# Patient Record
Sex: Female | Born: 1985 | Race: White | Hispanic: No | Marital: Married | Smoking: Never smoker
Health system: Southern US, Community
[De-identification: ages and names within clinical notes are randomized; demographics above are authoritative.]

## PROBLEM LIST (undated history)

## (undated) DIAGNOSIS — B009 Herpesviral infection, unspecified: Secondary | ICD-10-CM

## (undated) DIAGNOSIS — R87612 Low grade squamous intraepithelial lesion on cytologic smear of cervix (LGSIL): Secondary | ICD-10-CM

## (undated) HISTORY — PX: OTHER SURGICAL HISTORY: SHX169

## (undated) HISTORY — DX: Low grade squamous intraepithelial lesion on cytologic smear of cervix (LGSIL): R87.612

## (undated) HISTORY — DX: Herpesviral infection, unspecified: B00.9

---

## 2010-07-17 ENCOUNTER — Ambulatory Visit: Payer: Self-pay

## 2013-07-12 ENCOUNTER — Ambulatory Visit: Payer: Self-pay

## 2013-09-24 ENCOUNTER — Ambulatory Visit: Payer: Self-pay | Admitting: Physician Assistant

## 2013-09-24 LAB — RAPID STREP-A WITH REFLX: Micro Text Report: NEGATIVE

## 2013-09-26 LAB — BETA STREP CULTURE(ARMC)

## 2013-12-18 ENCOUNTER — Ambulatory Visit: Payer: Self-pay | Admitting: Physician Assistant

## 2013-12-18 LAB — URINALYSIS, COMPLETE
BLOOD: NEGATIVE
Bilirubin,UR: NEGATIVE
GLUCOSE, UR: NEGATIVE mg/dL (ref 0–75)
KETONE: NEGATIVE
Leukocyte Esterase: NEGATIVE
NITRITE: NEGATIVE
PH: 6 (ref 4.5–8.0)
PROTEIN: NEGATIVE
Specific Gravity: 1.01 (ref 1.003–1.030)

## 2013-12-18 LAB — PREGNANCY, URINE: Pregnancy Test, Urine: POSITIVE m[IU]/mL

## 2014-01-30 DIAGNOSIS — R87612 Low grade squamous intraepithelial lesion on cytologic smear of cervix (LGSIL): Secondary | ICD-10-CM

## 2014-01-30 HISTORY — DX: Low grade squamous intraepithelial lesion on cytologic smear of cervix (LGSIL): R87.612

## 2017-08-26 ENCOUNTER — Encounter: Payer: Self-pay | Admitting: Certified Nurse Midwife

## 2017-08-26 ENCOUNTER — Ambulatory Visit (INDEPENDENT_AMBULATORY_CARE_PROVIDER_SITE_OTHER): Payer: BLUE CROSS/BLUE SHIELD | Admitting: Certified Nurse Midwife

## 2017-08-26 VITALS — BP 119/88 | HR 67 | Ht 63.0 in | Wt 145.4 lb

## 2017-08-26 DIAGNOSIS — N898 Other specified noninflammatory disorders of vagina: Secondary | ICD-10-CM | POA: Diagnosis not present

## 2017-08-26 MED ORDER — FLUCONAZOLE 150 MG PO TABS: 150.0000 mg | ORAL_TABLET | Freq: Once | ORAL | 0 refills | Status: AC

## 2017-08-26 NOTE — Patient Instructions (Signed)

## 2017-08-26 NOTE — Progress Notes (Signed)
GYN ENCOUNTER NOTE  Subjective:       Theresa Dudley is a 31 y.o. No obstetric history on file. female is here for gynecologic evaluation of the following issues:  1. Vaginal itching for  2 wks. She states that she has used coconut oil, gone with out panties at bed time and stopped shaving which has helped. She does not complain of increased discharge and denies odor. 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 She has not tried [facility]. She is sexually active with one female partner (husband). Denies possibility of STD and declines STD testing.      Gynecologic History Patient's last menstrual period was 08/20/2017 (exact date). Contraception: vasectomy Last Pap:09/14/2014. Results were: normal Last mammogram: N/A.   Obstetric History OB History  Gravida Para Term Preterm AB Living  SAB TAB Ectopic Multiple Live Births          1    # Outcome Date GA Lbr Len/2nd Weight Sex Delivery Anes PTL Lv  1 Term 08/09/14 [redacted]w[redacted]d  7 lb 1.6 oz (3.221 kg) F Vag-Spont   LIV    Obstetric Comments  2015-umbilical cord around baby's neck    Past Medical History:  Diagnosis Date  . HSV infection    history  . 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 none      No current outpatient prescriptions on file prior to visit.   No current facility-administered medications on file prior to visit.     No Known Allergies  Social History   Social History  . Marital status: Single    Spouse name: N/A  . Number of children: N/A  . Years of education: N/A   Occupational History  . Not on file.   Social History Main Topics  . Smoking status: Never Smoker  . Smokeless tobacco: Never Used  . Alcohol use Yes  . Drug use: No  . Sexual activity: Yes    Partners: Male    Birth control/ protection: Other-see comments     Comment: VASECTOMY   Other Topics Concern  . Not on file   Social History Narrative   . No narrative on file    Family History  Problem Relation Age of Onset  . Seizures Mother   . Hypertension Father   . Seizures Sister   . Cancer Maternal Grandmother 1       breast   . Thyroid disease Maternal Grandmother     The following portions of the patient's history were reviewed and updated as appropriate: allergies, current medications, past family history, past medical history, past social history, past surgical history and problem list.  Review of Systems Review of Systems - Negative except as mentioned in HPI Review of Systems - General ROS: negative for - chills, fatigue, fever, hot flashes, malaise or night sweats Hematological and Lymphatic ROS: negative for - bleeding problems or swollen lymph nodes Gastrointestinal ROS: negative for - abdominal pain, blood in stools, change in bowel habits and nausea/vomiting Musculoskeletal ROS: negative for - joint pain, muscle pain or muscular weakness Genito-Urinary ROS: negative for - change in menstrual cycle, dysmenorrhea, dyspareunia, dysuria, genital discharge, genital ulcers, hematuria, incontinence, irregular/heavy menses, nocturia or pelvic pain. Positive: itching  Objective:   BP 119/88   Pulse 67   Ht  (1.6 m)   Wt 145 lb 6.4 oz (  66 kg)   LMP 08/20/2017 (Exact Date) Comment: HUSBSAND HAD VASECTOMY  BMI 25.76 kg/m  CONSTITUTIONAL: Well-developed, well-nourished female in no acute distress.  HENT:  Normocephalic, atraumatic.  NECK: Normal range of motion, supple SKIN: Skin is warm and dry. No rash noted. Not diaphoretic. No erythema. No pallor. NEUROLGIC: Alert and oriented to person, place, and time.  PSYCHIATRIC: Normal mood and affect. Normal behavior. Normal judgment and thought content. CARDIOVASCULAR:Not Examined RESPIRATORY: Not Examined BREASTS: Not Examined ABDOMEN: Soft, non distended; Non tender.  No Organomegaly. PELVIC:  External Genitalia: Normal, no signs of active HSV out break, redness  on labia majora  BUS: Normal  Vagina: Normal, clear discharge  Cervix: Normal MUSCULOSKELETAL: Normal range of motion. No tenderness.  No cyanosis, clubbing, or edema.  Assessment:   Vaginal Itching  Plan:   Sample for Monistat for external itching. Encouraged use of boric acid to prevent vaginal infections. Nuswab today . Diflucan ordered.  Will follow up with results of swab. Encouraged pt to return for physical exam at earliest convince.    Doreene Burke, CNM

## 2017-08-28 ENCOUNTER — Encounter: Payer: Self-pay | Admitting: Certified Nurse Midwife

## 2017-08-28 LAB — NUSWAB BV AND CANDIDA, NAA
Candida albicans, NAA: POSITIVE — AB
Candida glabrata, NAA: NEGATIVE

## 2018-03-31 ENCOUNTER — Ambulatory Visit (INDEPENDENT_AMBULATORY_CARE_PROVIDER_SITE_OTHER): Payer: BLUE CROSS/BLUE SHIELD | Admitting: Certified Nurse Midwife

## 2018-03-31 ENCOUNTER — Encounter: Payer: Self-pay | Admitting: Certified Nurse Midwife

## 2018-03-31 VITALS — BP 123/88 | HR 74 | Ht 63.0 in | Wt 152.1 lb

## 2018-03-31 DIAGNOSIS — Z0000 Encounter for general adult medical examination without abnormal findings: Secondary | ICD-10-CM

## 2018-03-31 DIAGNOSIS — Z124 Encounter for screening for malignant neoplasm of cervix: Secondary | ICD-10-CM

## 2018-03-31 NOTE — Progress Notes (Signed)
GYNECOLOGY ANNUAL PREVENTATIVE CARE ENCOUNTER NOTE  Subjective:   Theresa Dudley is a 32 y.o. G68P1001 female here for a routine annual gynecologic exam.  Current complaints of heavy bleeding and pain at the beginning of her cycle, first 2 days. Pt denies clots, or soaking a pad or more and hour.  Denies  discharge, pelvic pain, problems with intercourse or other gynecologic concerns.  Gynecologic History Patient's last menstrual period was 03/06/2018 (exact date). Contraception: none Last Pap: last year per patient. Results were: Normal, hx of abnormal LGSIL that reverted to 6 week PPV. Last mammogram:n/a. Obstetric History OB History  Gravida Para Term Preterm AB Living  SAB TAB Ectopic Multiple Live Births          1    # Outcome Date GA Lbr Len/2nd Weight Sex Delivery Anes PTL Lv  1 Term 08/09/14 [redacted]w[redacted]d  7 lb 1.6 oz (3.221 kg) F Vag-Spont   LIV    Obstetric Comments  2015-umbilical cord around baby's neck    Past Medical History:  Diagnosis Date  . HSV infection    history  . 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 none      Current Outpatient Medications on File Prior to Visit  Medication Sig Dispense Refill  . clonazePAM (KLONOPIN) 0.5 MG tablet Take by mouth.    . Probiotic Product (PROBIOTIC-10 PO) Take by mouth.    . SF 5000 PLUS 1.1 % CREA dental cream   3  . sodium fluoride (PREVIDENT) 1.1 % GEL dental gel     . valACYclovir (VALTREX) 1000 MG tablet TK 2 TS PO IN THE MORNING AND IN THE EVE  3  . venlafaxine XR (EFFEXOR-XR) 150 MG 24 hr capsule TK ONE C PO D AT 8 AM  1   No current facility-administered medications on file prior to visit.     No Known Allergies  Social History   Socioeconomic History  . Marital status: Single    Spouse name: Not on file  . Number of children: Not on file  . Years of education: Not on file  . Highest education level: Not on file  Occupational History   . Not on file  Social Needs  . Financial resource strain: Not on file  . Food insecurity:    Worry: Not on file    Inability: Not on file  . Transportation needs:    Medical: Not on file    Non-medical: Not on file  Tobacco Use  . Smoking status: Never Smoker  . Smokeless tobacco: Never Used  Substance and Sexual Activity  . Alcohol use: Yes  . Drug use: No  . Sexual activity: Yes    Partners: Male    Birth control/protection: Other-see comments    Comment: VASECTOMY  Lifestyle  . Physical activity:    Days per week: Not on file    Minutes per session: Not on file  . Stress: Not on file  Relationships  . Social connections:    Talks on phone: Not on file    Gets together: Not on file    Attends religious service: Not on file    Active member of club or organization: Not on file    Attends meetings of clubs or organizations: Not on file    Relationship status: Not on file  . Intimate partner violence:    Fear  of current or ex partner: Not on file    Emotionally abused: Not on file    Physically abused: Not on file    Forced sexual activity: Not on file  Other Topics Concern  . Not on file  Social History Narrative  . Not on file    Family History  Problem Relation Age of Onset  . Seizures Mother   . Hypertension Father   . Seizures Sister   . Cancer Maternal Grandmother 75       breast   . Thyroid disease Maternal Grandmother     The following portions of the patient's history were reviewed and updated as appropriate: allergies, current medications, past family history, past medical history, past social history, past surgical history and problem list.  Review of Systems Pertinent items noted in HPI and remainder of comprehensive ROS otherwise negative.   Objective:  BP 123/88   Pulse 74   Ht  (1.6 m)   Wt 152 lb 1 oz (69 kg)   LMP 03/06/2018 (Exact Date)   BMI 26.94 kg/m  CONSTITUTIONAL: Well-developed, well-nourished female in no acute  distress.  HENT:  Normocephalic, atraumatic, External right and left ear normal. Oropharynx is clear and moist EYES: Conjunctivae and EOM are normal. Pupils are equal, round, and reactive to light. No scleral icterus.  NECK: Normal range of motion, supple, no masses.  Normal thyroid.  SKIN: Skin is warm and dry. No rash noted. Not diaphoretic. No erythema. No pallor. NEUROLOGIC: Alert and oriented to person, place, and time. Normal reflexes, muscle tone coordination. No cranial nerve deficit noted. PSYCHIATRIC: Normal mood and affect. Normal behavior. Normal judgment and thought content. CARDIOVASCULAR: Normal heart rate noted, regular rhythm RESPIRATORY: Clear to auscultation bilaterally. Effort and breath sounds normal, no problems with respiration noted. BREASTS: Symmetric in size. No masses, skin changes, nipple drainage, or lymphadenopathy. ABDOMEN: Soft, normal bowel sounds, no distention noted.  No tenderness, rebound or guarding.  PELVIC: Normal appearing external genitalia; normal appearing vaginal mucosa and cervix.  No abnormal discharge noted.  Pap smear obtained.  Normal uterine size, no other palpable masses, no uterine or adnexal tenderness. MUSCULOSKELETAL: Normal range of motion. No tenderness.  No cyanosis, clubbing, or edema.  2+ distal pulses.   Assessment and Plan:  1. Screening for cervical cancer - Pap IG and HPV (high risk) DNA detection Pt decline BC today, would like to try homeopathic methods for heavy bleeding. Offered Lysteda-declined. Encourage use of ibuprofen 2 days prior to onset of period. Lipid Panel today. Will follow up results of pap smear and manage accordingly. Routine preventative health maintenance measures emphasized. Please refer to After Visit Summary for other counseling recommendations.  Follow 1 year annual visit.   Shanika Creacy,CNM/Sharnise Blough,CNM

## 2018-03-31 NOTE — Progress Notes (Signed)
Pt is here for her annual exam

## 2018-03-31 NOTE — Patient Instructions (Signed)
Preventive Care 18-39 Years, Female Preventive care refers to lifestyle choices and visits with your health care provider that can promote health and wellness. What does preventive care include?  A yearly physical exam. This is also called an annual well check.  Dental exams once or twice a year.  Routine eye exams. Ask your health care provider how often you should have your eyes checked.  Personal lifestyle choices, including: ? Daily care of your teeth and gums. ? Regular physical activity. ? Eating a healthy diet. ? Avoiding tobacco and drug use. ? Limiting alcohol use. ? Practicing safe sex. ? Taking vitamin and mineral supplements as recommended by your health care provider. What happens during an annual well check? The services and screenings done by your health care provider during your annual well check will depend on your age, overall health, lifestyle risk factors, and family history of disease. Counseling Your health care provider may ask you questions about your:  Alcohol use.  Tobacco use.  Drug use.  Emotional well-being.  Home and relationship well-being.  Sexual activity.  Eating habits.  Work and work Statistician.  Method of birth control.  Menstrual cycle.  Pregnancy history.  Screening You may have the following tests or measurements:  Height, weight, and BMI.  Diabetes screening. This is done by checking your blood sugar (glucose) after you have not eaten for a while (fasting).  Blood pressure.  Lipid and cholesterol levels. These may be checked every 5 years starting at age 66.  Skin check.  Hepatitis C blood test.  Hepatitis B blood test.  Sexually transmitted disease (STD) testing.  BRCA-related cancer screening. This may be done if you have a family history of breast, ovarian, tubal, or peritoneal cancers.  Pelvic exam and Pap test. This may be done every 3 years starting at age 40. Starting at age 59, this may be done every 5  years if you have a Pap test in combination with an HPV test.  Discuss your test results, treatment options, and if necessary, the need for more tests with your health care provider. Vaccines Your health care provider may recommend certain vaccines, such as:  Influenza vaccine. This is recommended every year.  Tetanus, diphtheria, and acellular pertussis (Tdap, Td) vaccine. You may need a Td booster every 10 years.  Varicella vaccine. You may need this if you have not been vaccinated.  HPV vaccine. If you are 69 or younger, you may need three doses over 6 months.  Measles, mumps, and rubella (MMR) vaccine. You may need at least one dose of MMR. You may also need a second dose.  Pneumococcal 13-valent conjugate (PCV13) vaccine. You may need this if you have certain conditions and were not previously vaccinated.  Pneumococcal polysaccharide (PPSV23) vaccine. You may need one or two doses if you smoke cigarettes or if you have certain conditions.  Meningococcal vaccine. One dose is recommended if you are age 27-21 years and a first-year college student living in a residence hall, or if you have one of several medical conditions. You may also need additional booster doses.  Hepatitis A vaccine. You may need this if you have certain conditions or if you travel or work in places where you may be exposed to hepatitis A.  Hepatitis B vaccine. You may need this if you have certain conditions or if you travel or work in places where you may be exposed to hepatitis B.  Haemophilus influenzae type b (Hib) vaccine. You may need this if  you have certain risk factors.  Talk to your health care provider about which screenings and vaccines you need and how often you need them. This information is not intended to replace advice given to you by your health care provider. Make sure you discuss any questions you have with your health care provider. Document Released: 01/06/2002 Document Revised: 07/30/2016  Document Reviewed: 09/11/2015 Elsevier Interactive Patient Education  [employer].

## 2018-04-01 ENCOUNTER — Encounter (INDEPENDENT_AMBULATORY_CARE_PROVIDER_SITE_OTHER): Payer: Self-pay

## 2018-04-01 LAB — LIPID PANEL
CHOL/HDL RATIO: 2.1 ratio (ref 0.0–4.4)
CHOLESTEROL TOTAL: 122 mg/dL (ref 100–199)
HDL: 59 mg/dL (ref >39)
LDL CALC: 52 mg/dL (ref 0–99)
TRIGLYCERIDES: 53 mg/dL (ref 0–149)
VLDL Cholesterol Cal: 11 mg/dL (ref 5–40)

## 2018-04-12 ENCOUNTER — Encounter (INDEPENDENT_AMBULATORY_CARE_PROVIDER_SITE_OTHER): Payer: Self-pay

## 2018-04-12 ENCOUNTER — Other Ambulatory Visit: Payer: Self-pay | Admitting: Certified Nurse Midwife

## 2018-04-12 LAB — PAP IG AND HPV HIGH-RISK
HPV, HIGH-RISK: NEGATIVE
PAP Smear Comment: 0

## 2018-04-12 MED ORDER — FLUCONAZOLE 150 MG PO TABS: 150.0000 mg | ORAL_TABLET | Freq: Once | ORAL | 0 refills | Status: AC

## 2018-04-12 NOTE — Progress Notes (Signed)
Pap smear shows yeast . Order placed. pT notified via my chart.   Doreene Burke, CNM

## 2018-04-27 ENCOUNTER — Encounter: Payer: Self-pay | Admitting: Certified Nurse Midwife

## 2018-10-12 ENCOUNTER — Other Ambulatory Visit: Payer: Self-pay

## 2018-10-12 MED ORDER — FLUCONAZOLE 150 MG PO TABS: 150.0000 mg | ORAL_TABLET | Freq: Once | ORAL | 1 refills | Status: AC

## 2018-10-26 ENCOUNTER — Ambulatory Visit: Payer: BLUE CROSS/BLUE SHIELD | Admitting: Certified Nurse Midwife

## 2018-10-26 ENCOUNTER — Encounter: Payer: Self-pay | Admitting: Certified Nurse Midwife

## 2018-10-26 ENCOUNTER — Encounter: Payer: BLUE CROSS/BLUE SHIELD | Admitting: Certified Nurse Midwife

## 2018-10-26 VITALS — BP 129/88 | HR 90 | Ht 63.0 in | Wt 165.4 lb

## 2018-10-26 DIAGNOSIS — R102 Pelvic and perineal pain: Secondary | ICD-10-CM | POA: Diagnosis not present

## 2018-10-26 DIAGNOSIS — N939 Abnormal uterine and vaginal bleeding, unspecified: Secondary | ICD-10-CM

## 2018-10-26 DIAGNOSIS — R350 Frequency of micturition: Secondary | ICD-10-CM

## 2018-10-26 DIAGNOSIS — R635 Abnormal weight gain: Secondary | ICD-10-CM | POA: Diagnosis not present

## 2018-10-26 DIAGNOSIS — M545 Low back pain, unspecified: Secondary | ICD-10-CM

## 2018-10-26 NOTE — Patient Instructions (Signed)
Pelvic Pain, Female °Pelvic pain is pain in your lower belly (abdomen), below your belly button and between your hips. The pain may start suddenly (acute), keep coming back (recurring), or last a long time (chronic). Pelvic pain that lasts longer than six months is considered chronic. There are many causes of pelvic pain. Sometimes the cause of your pelvic pain is not known. °Follow these instructions at home: °· Take over-the-counter and prescription medicines only as told by your doctor. °· Rest as told by your doctor. °· Do not have sex it if hurts. °· Keep a journal of your pelvic pain. Write down: °? When the pain started. °? Where the pain is located. °? What seems to make the pain better or worse, such as food or your menstrual cycle. °? Any symptoms you have along with the pain. °· Keep all follow-up visits as told by your doctor. This is important. °Contact a doctor if: °· Medicine does not help your pain. °· Your pain comes back. °· You have new symptoms. °· You have unusual vaginal discharge or bleeding. °· You have a fever or chills. °· You are having a hard time pooping (constipation). °· You have blood in your pee (urine) or poop (stool). °· Your pee smells bad. °· You feel weak or lightheaded. °Get help right away if: °· You have sudden pain that is very bad. °· Your pain continues to get worse. °· You have very bad pain and also have any of the following symptoms: °? A fever. °? Feeling stick to your stomach (nausea). °? Throwing up (vomiting). °? Being very sweaty. °· You pass out (lose consciousness). °This information is not intended to replace advice given to you by your health care provider. Make sure you discuss any questions you have with your health care provider. °Document Released: 04/28/2008 Document Revised: 12/05/2015 Document Reviewed: 08/31/2015 °Elsevier Interactive Patient Education © 2018 Elsevier Inc. ° °

## 2018-10-26 NOTE — Progress Notes (Signed)
GYN ENCOUNTER NOTE  Subjective:       Theresa Dudley is a 32 y.o. G21P1001 female is here for gynecologic evaluation of the following issues:  1. Pelvic pain that is mostly on her right side. It radiates around to her back. She has also experienced weight gain, bloating. She state the pain is 6-7 constant pain that has responded with motrin. She went to the ED and they gave her flexeril that has also helped with the back pain.   She is having frequency in urination but other wise her urine and bowel movements are normal. She is having monthly periods.    Gynecologic History Patient's last menstrual period was 10/14/2018 (exact date). Contraception: vasectomy Last Pap: 03/31/2018. Results were: ASCUS, HPV negative Last mammogram: N/A.   Obstetric History OB History  Gravida Para Term Preterm AB Living  1 1 1     1   SAB TAB Ectopic Multiple Live Births          1    # Outcome Date GA Lbr Len/2nd Weight Sex Delivery Anes PTL Lv  1 Term 08/09/14 [redacted]w[redacted]d  7 lb 1.6 oz (3.221 kg) F Vag-Spont   LIV    Obstetric Comments  2015-umbilical cord around baby's neck    Past Medical History:  Diagnosis Date  . HSV infection    history  . 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 none      Current Outpatient Medications on File Prior to Visit  Medication Sig Dispense Refill  . clonazePAM (KLONOPIN) 0.5 MG tablet Take by mouth.    . cyclobenzaprine (FLEXERIL) 5 MG tablet   0  . DULoxetine (CYMBALTA) 20 MG capsule Take by mouth.    . Probiotic Product (PROBIOTIC-10 PO) Take by mouth.    . propranolol (INDERAL) 40 MG tablet   0  . sodium fluoride (PREVIDENT) 1.1 % GEL dental gel     . traZODone (DESYREL) 50 MG tablet TK 1 TO 2 TS PO Q NIGHT PRF SLP  1  . valACYclovir (VALTREX) 1000 MG tablet TK 2 TS PO IN THE MORNING AND IN THE EVE  3   No current facility-administered medications on file prior to visit.     No Known Allergies  Social  History   Socioeconomic History  . Marital status: Single    Spouse name: Not on file  . Number of children: Not on file  . Years of education: Not on file  . Highest education level: Not on file  Occupational History  . Not on file  Social Needs  . Financial resource strain: Not on file  . Food insecurity:    Worry: Not on file    Inability: Not on file  . Transportation needs:    Medical: Not on file    Non-medical: Not on file  Tobacco Use  . Smoking status: Never Smoker  . Smokeless tobacco: Never Used  Substance and Sexual Activity  . Alcohol use: Yes  . Drug use: No  . Sexual activity: Yes    Partners: Male    Birth control/protection: Other-see comments    Comment: VASECTOMY  Lifestyle  . Physical activity:    Days per week: Not on file    Minutes per session: Not on file  . Stress: Not on file  Relationships  . Social connections:    Talks on phone: Not on file    Gets together: Not  on file    Attends religious service: Not on file    Active member of club or organization: Not on file    Attends meetings of clubs or organizations: Not on file    Relationship status: Not on file  . Intimate partner violence:    Fear of current or ex partner: Not on file    Emotionally abused: Not on file    Physically abused: Not on file    Forced sexual activity: Not on file  Other Topics Concern  . Not on file  Social History Narrative  . Not on file    Family History  Problem Relation Age of Onset  . Seizures Mother   . Hypertension Father   . Seizures Sister   . Cancer Maternal Grandmother 363       breast   . Thyroid disease Maternal Grandmother     The following portions of the patient's history were reviewed and updated as appropriate: allergies, current medications, past family history, past medical history, past social history, past surgical history and problem list.  Review of Systems Review of Systems - Negative except as mentioned in hpi Review of  Systems - General ROS: negative for - chills, fatigue, fever, hot flashes, malaise or night sweats Hematological and Lymphatic ROS: negative for - bleeding problems or swollen lymph nodes Gastrointestinal ROS: negative for - abdominal pain, blood in stools, change in bowel habits and nausea/vomiting. Positive for bloating  Musculoskeletal ROS: negative for - joint pain, muscle pain or muscular weakness Genito-Urinary ROS: negative for - change in menstrual cycle, dysmenorrhea, dyspareunia, dysuria, genital discharge, genital ulcers, hematuria, incontinence, irregular/heavy menses, nocturia. Positive for pelvic pain  Objective:   BP 129/88   Pulse 90   Ht 5\' 3"  (1.6 m)   Wt 165 lb 7 oz (75 kg)   LMP 10/14/2018 (Exact Date)   BMI 29.31 kg/m  CONSTITUTIONAL: Well-developed, well-nourished female in no acute distress.  HENT:  Normocephalic, atraumatic.  NECK: Normal range of motion, supple, no masses.  Normal thyroid.  SKIN: Skin is warm and dry. No rash noted. Not diaphoretic. No erythema. No pallor. NEUROLGIC: Alert and oriented to person, place, and time. PSYCHIATRIC: Normal mood and affect. Normal behavior. Normal judgment and thought content. CARDIOVASCULAR:Not Examined RESPIRATORY: Not Examined BREASTS: Not Examined ABDOMEN: Soft, non distended; tenderness left lower quadrant with palpation.  No Organomegaly. PELVIC:not indicated  MUSCULOSKELETAL: Normal range of motion. No tenderness.  No cyanosis, clubbing, or edema.  Assessment:   1. Weight gain TSH  2. Abnormal uterine bleeding - CBC  3. Pelvic pain - US PELVIS (TRANSABDOMINAL ONLY); Future     Plan:  Discussed possible causes of pain. PT admits to having a history of ovarian cyst but states it does not feel like that. Urine culture , TSH, CBC, u/s ordered. Referral to chiropractor for back pain. Discussed referral to gastro. If all test negative. She verbalizes and agrees to plan of care.   I attest more than 50% of  visit spent reviewing history , discussing possible causes including ovarian cyst, congestive pelvic syndrome, and UTI. Plan of care reviewed. Face to face time 15 min.   Doreene BurkeAnnie Kiran Carline, CNM

## 2018-10-27 ENCOUNTER — Ambulatory Visit (INDEPENDENT_AMBULATORY_CARE_PROVIDER_SITE_OTHER): Payer: BLUE CROSS/BLUE SHIELD

## 2018-10-27 DIAGNOSIS — R102 Pelvic and perineal pain: Secondary | ICD-10-CM

## 2018-10-27 LAB — CBC
Hematocrit: 36.1 % (ref 34.0–46.6)
Hemoglobin: 12.4 g/dL (ref 11.1–15.9)
MCH: 29.3 pg (ref 26.6–33.0)
MCHC: 34.3 g/dL (ref 31.5–35.7)
MCV: 85 fL (ref 79–97)
Platelets: 309 10*3/uL (ref 150–450)
RBC: 4.23 x10E6/uL (ref 3.77–5.28)
RDW: 12.9 % (ref 12.3–15.4)
WBC: 8.6 10*3/uL (ref 3.4–10.8)

## 2018-10-27 LAB — TSH: TSH: 1.87 u[IU]/mL (ref 0.450–4.500)

## 2018-10-28 ENCOUNTER — Other Ambulatory Visit: Payer: Self-pay | Admitting: Family Medicine

## 2018-10-28 DIAGNOSIS — R1084 Generalized abdominal pain: Secondary | ICD-10-CM

## 2018-10-28 LAB — URINE CULTURE: Organism ID, Bacteria: NO GROWTH

## 2018-11-01 ENCOUNTER — Ambulatory Visit
Admission: RE | Admit: 2018-11-01 | Discharge: 2018-11-01 | Disposition: A | Discharge status: Home / Self Care | Payer: BLUE CROSS/BLUE SHIELD | Source: Ambulatory Visit | Attending: Family Medicine | Admitting: Family Medicine

## 2018-11-01 DIAGNOSIS — R1084 Generalized abdominal pain: Secondary | ICD-10-CM | POA: Insufficient documentation

## 2018-11-02 ENCOUNTER — Ambulatory Visit
Admission: RE | Admit: 2018-11-02 | Discharge: 2018-11-02 | Disposition: A | Discharge status: Home / Self Care | Payer: BLUE CROSS/BLUE SHIELD | Source: Ambulatory Visit | Attending: Family Medicine | Admitting: Family Medicine

## 2018-11-02 DIAGNOSIS — R1084 Generalized abdominal pain: Secondary | ICD-10-CM | POA: Insufficient documentation

## 2018-11-02 DIAGNOSIS — K824 Cholesterolosis of gallbladder: Secondary | ICD-10-CM | POA: Diagnosis not present

## 2018-11-02 DIAGNOSIS — R635 Abnormal weight gain: Secondary | ICD-10-CM | POA: Insufficient documentation

## 2018-11-09 ENCOUNTER — Other Ambulatory Visit: Payer: Self-pay | Admitting: Certified Nurse Midwife

## 2018-11-09 DIAGNOSIS — R140 Abdominal distension (gaseous): Secondary | ICD-10-CM

## 2018-11-09 NOTE — Progress Notes (Signed)
Abdominal bloating, order placed to gastro.   Doreene BurkeAnnie Saryna Kneeland, CNM

## 2018-12-08 ENCOUNTER — Ambulatory Visit (INDEPENDENT_AMBULATORY_CARE_PROVIDER_SITE_OTHER): Payer: BLUE CROSS/BLUE SHIELD | Admitting: Obstetrics and Gynecology

## 2018-12-08 ENCOUNTER — Encounter: Payer: Self-pay | Admitting: Obstetrics and Gynecology

## 2018-12-08 VITALS — BP 125/83 | HR 85 | Ht 63.0 in | Wt 164.9 lb

## 2018-12-08 DIAGNOSIS — Z30011 Encounter for initial prescription of contraceptive pills: Secondary | ICD-10-CM | POA: Diagnosis not present

## 2018-12-08 DIAGNOSIS — N920 Excessive and frequent menstruation with regular cycle: Secondary | ICD-10-CM

## 2018-12-08 MED ORDER — DESOGESTREL-ETHINYL ESTRADIOL 0.15-0.02/0.01 MG (21/5) PO TABS: 1.0000 | ORAL_TABLET | Freq: Every day | ORAL | 1 refills | Status: DC

## 2018-12-08 NOTE — Progress Notes (Signed)
HPI:      [name] is a 33 y.o. G1P1001 who LMP was Patient's last menstrual period was 11/11/2018.  Subjective:   She presents today as referral from Doreene Burke.  Since the birth of her daughter she has had very heavy menstrual bleeding lasting 7 days.  She does not like the heavy bleeding and cramping and would like a solution.  She has taken OCPs in the past and they have made her cycles lighter but she is not sure if she wants something containing "hormones."  She is also specifically inquiring about endometrial ablation. Of significant note the patient's husband has a vasectomy-so birth control is not an issue. Patient also has an issue with recurrent ovarian cysts, breast cysts which appear cyclically and some acne.     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 She does not have a problem list on file. She  has a past surgical history that includes none. Her family history includes Cancer (age of onset: 76) in her maternal grandmother; Hypertension in her father; Seizures in her mother and sister; Thyroid disease in her maternal grandmother. She  reports that she has never smoked. She has never used smokeless tobacco. She reports current alcohol use. She reports that she does not use drugs. She has a current medication list which includes the following prescription(s): clonazepam, duloxetine, probiotic product, sodium fluoride, trazodone, valacyclovir, and desogestrel-ethinyl estradiol. She has No Known Allergies.       Review of Systems:  Review of Systems  Constitutional: Denied constitutional symptoms, night sweats, recent illness, fatigue, fever, insomnia and weight loss.  Eyes: Denied eye symptoms, eye pain, photophobia, vision change and visual disturbance.  Ears/Nose/Throat/Neck: Denied ear, nose, throat or  neck symptoms, hearing loss, nasal discharge, sinus congestion and sore throat.  Cardiovascular: Denied cardiovascular symptoms, arrhythmia, chest pain/pressure, edema, exercise intolerance, orthopnea and palpitations.  Respiratory: Denied pulmonary symptoms, asthma, pleuritic pain, productive sputum, cough, dyspnea and wheezing.  Gastrointestinal: Denied, gastro-esophageal reflux, melena, nausea and vomiting.  Genitourinary: See HPI for additional information.  Musculoskeletal: Denied musculoskeletal symptoms, stiffness, swelling, muscle weakness and myalgia.  Dermatologic: Denied dermatology symptoms, rash and scar.  Neurologic: Denied neurology symptoms, dizziness, headache, neck pain and syncope.  Psychiatric: Denied psychiatric symptoms, anxiety and depression.  Endocrine: Denied endocrine symptoms including hot flashes and night sweats.   Meds:   Current Outpatient Medications on File Prior to Visit  Medication Sig Dispense Refill  . clonazePAM (KLONOPIN) 0.5 MG tablet Take by mouth.    . DULoxetine (CYMBALTA) 20 MG capsule Take by mouth.    . Probiotic Product (PROBIOTIC-10 PO) Take by mouth.    . sodium fluoride (PREVIDENT) 1.1 % GEL dental gel     . traZODone (DESYREL) 50 MG tablet TK 1 TO 2 TS PO Q NIGHT PRF SLP  1  . valACYclovir (VALTREX) 1000 MG tablet TK 2 TS PO IN THE MORNING AND IN THE EVE  3   No current facility-administered medications on file prior to visit.     Objective:     Vitals:   12/08/18 0855  BP: 125/83  Pulse: 85                Assessment:    G1P1001 There are no active problems to  display for this patient.    1. Menorrhagia with regular cycle   2. Initiation of OCP (BCP)     Patient has menorrhagia with regular cycle.  Adenomyosis is a possibility.   Plan:            1.  We have discussed endometrial ablation, OCPs, and IUD for control of her menorrhagia.  The risks and benefits of each were systematically reviewed in detail.  Both  hormonal and nonhormonal solutions and effects were discussed.  After a very long discussion she has chosen OCPs as her best option because it will give her cycle control as it has in the past, help with her breast cysts and ovarian cysts as well as her acne. The option of a monthly cycle control pill versus a 21-month cycle pill was discussed with her and she has chosen a monthly cyclic pill. Of course should she fail OCPs she will again consider the option of IUD versus endometrial ablation. 2.OCPs The risks /benefits of OCPs have been explained to the patient in detail.  Product literature has been given to her.  I have instructed her in the use of OCPs and have given her literature reinforcing this information.  I have explained to the patient that OCPs are not as effective for birth control during the first month of use, and that another form of contraception should be used during this time.  Both first-day start and Sunday start have been explained.  The risks and benefits of each was discussed.  She has been made aware of  the fact that other medications may affect the efficacy of OCPs.  I have answered all of her questions, and I believe that she has an understanding of the effectiveness and use of OCPs.  Orders No orders of the defined types were placed in this encounter.    Meds ordered this encounter  Medications  . desogestrel-ethinyl estradiol (MIRCETTE) 0.15-0.02/0.01 MG (21/5) tablet    Sig: Take 1 tablet by mouth at bedtime.    Dispense:  3 Package    Refill:  1      F/U  Return in about 4 months (around 04/08/2019). I spent 27 minutes involved in the care of this patient of which greater than 50% was spent discussing menorrhagia and its causes, risks and benefits of endometrial ablation OCPs and IUD.  Please see above.  All questions answered.  Elonda Husky, M.D. 12/08/2018 10:05 AM

## 2018-12-08 NOTE — Progress Notes (Signed)
Patient comes in today to discuss having an ablation. She is an Programmer, multimedia patient and has discussed with Pattricia Boss. She is having heavy bleeding, cramping, and bloating during her menstrual cycle. Her husband has had a vasectomy.

## 2018-12-27 ENCOUNTER — Other Ambulatory Visit: Payer: Self-pay

## 2018-12-27 ENCOUNTER — Encounter: Payer: Self-pay | Admitting: Emergency Medicine

## 2018-12-27 ENCOUNTER — Ambulatory Visit
Admission: EM | Admit: 2018-12-27 | Discharge: 2018-12-27 | Disposition: A | Discharge status: Home / Self Care | Payer: BLUE CROSS/BLUE SHIELD | Attending: Family Medicine | Admitting: Family Medicine

## 2018-12-27 DIAGNOSIS — J111 Influenza due to unidentified influenza virus with other respiratory manifestations: Secondary | ICD-10-CM

## 2018-12-27 DIAGNOSIS — R0981 Nasal congestion: Secondary | ICD-10-CM

## 2018-12-27 DIAGNOSIS — J029 Acute pharyngitis, unspecified: Secondary | ICD-10-CM

## 2018-12-27 DIAGNOSIS — R69 Illness, unspecified: Principal | ICD-10-CM

## 2018-12-27 LAB — RAPID STREP SCREEN (MED CTR MEBANE ONLY): Streptococcus, Group A Screen (Direct): NEGATIVE

## 2018-12-27 MED ORDER — BALOXAVIR MARBOXIL(40 MG DOSE) 2 X 20 MG PO TBPK: 40.0000 mg | ORAL_TABLET | Freq: Once | ORAL | 0 refills | Status: AC

## 2018-12-27 NOTE — Discharge Instructions (Signed)
Take medication as prescribed. Rest. Drink plenty of fluids.  ° °Follow up with your primary care physician this week as needed. Return to Urgent care for new or worsening concerns.  ° °

## 2018-12-27 NOTE — ED Provider Notes (Signed)
[facility] ____________________________________________  Time seen: Approximately 8:01 PM  I have reviewed the triage vital signs and the nursing notes.   HISTORY  Chief Complaint Sore Throat and Nasal Congestion   HPI Theresa Dudley is a 33 y.o. female presenting for evaluation of nasal congestion, sore throat and body aches progressing today.  States minimal congestion earlier today, but reports congestion has been progressing.  States sore throat son this afternoon, stating it is mild.  States is even started with diffuse head to toe body aches.  Denies known fevers.  States stepdaughters with similar complaints starting today as well.  Has continued to eat and drink well.  Denies chest pain, shortness of breath, abdominal pain or rash.  Reports otherwise doing well denies other complaints.  No medications taken over-the-counter prior to arrival tonight.  Mariel Kansky, MD: PCP Patient's last menstrual period was 12/24/2018.denies pregnancy.    Past Medical History:  Diagnosis Date  . HSV infection    history  . LGSIL on Pap smear of cervix 01/30/2014    There are no active problems to display for this patient.   Past Surgical History:  Procedure Laterality Date  . none       No current facility-administered medications for this encounter.   Current Outpatient Medications:  .  clonazePAM (KLONOPIN) 0.5 MG tablet, Take by mouth., Disp: , Rfl:  .  desogestrel-ethinyl estradiol (MIRCETTE) 0.15-0.02/0.01 MG (21/5) tablet, Take 1 tablet by mouth at bedtime., Disp: 3 Package, Rfl: 1 .  DULoxetine (CYMBALTA) 20 MG capsule, Take by mouth., Disp: , Rfl:  .  Probiotic Product (PROBIOTIC-10 PO), Take by mouth., Disp: , Rfl:  .  sodium fluoride (PREVIDENT) 1.1 % GEL dental gel, , Disp: , Rfl:  .  traZODone (DESYREL) 50 MG tablet, TK 1 TO 2 TS PO Q NIGHT PRF SLP, Disp: , Rfl: 1 .  valACYclovir (VALTREX) 1000 MG tablet, TK 2 TS PO IN THE MORNING AND IN THE EVE, Disp:  , Rfl: 3 .  Baloxavir Marboxil,40 MG Dose, (XOFLUZA) 2 x 20 MG TBPK, Take 40 mg by mouth once for 1 dose., Disp: 2 each, Rfl: 0  Allergies Patient has no known allergies.  Family History  Problem Relation Age of Onset  . Seizures Mother   . Hypertension Father   . Seizures Sister   . Cancer Maternal Grandmother 18       breast   . Thyroid disease Maternal Grandmother     Social History Social History   Tobacco Use  . Smoking status: Never Smoker  . Smokeless tobacco: Never Used  Substance Use Topics  . Alcohol use: Yes  . Drug use: No    Review of Systems Constitutional: No fever ENT: as above.  Cardiovascular: Denies chest pain. Respiratory: Denies shortness of breath. Gastrointestinal: No abdominal pain.   Musculoskeletal: Negative for back pain. Skin: Negative for rash.   ____________________________________________   PHYSICAL EXAM:  VITAL SIGNS: ED Triage Vitals  Enc Vitals Group     BP 12/27/18 1920 (!) 135/97     Pulse Rate 12/27/18 1920 83     Resp 12/27/18 1920 18     Temp 12/27/18 1920 99.2 F (37.3 C)     Temp Source 12/27/18 1920 Oral     SpO2 12/27/18 1920 100 %     Weight 12/27/18 1918 160 lb (72.6 kg)     Height 12/27/18 1918 5\' 3"  (1.6 m)     Head Circumference --  Peak Flow --      Pain Score 12/27/18 1918 5     Pain Loc --      Pain Edu? --      Excl. in GC? --    Constitutional: Alert and oriented. Well appearing and in no acute distress. Eyes: Conjunctivae are normal.  Head: Atraumatic. No sinus tenderness to palpation. No swelling. No erythema.  Ears: no erythema, normal TMs bilaterally.   Nose:Nasal congestion   Mouth/Throat: Mucous membranes are moist. Mild pharyngeal erythema. No tonsillar swelling or exudate.  Neck: No stridor.  No cervical spine tenderness to palpation. Hematological/Lymphatic/Immunilogical: No cervical lymphadenopathy. Cardiovascular: Normal rate, regular rhythm. Grossly normal heart sounds.  Good  peripheral circulation. Respiratory: Normal respiratory effort.  No retractions. No wheezes, rales or rhonchi. Good air movement.  Gastrointestinal: Soft and nontender.  Musculoskeletal: Ambulatory with steady gait.  Neurologic:  Normal speech and language. No gait instability. Skin:  Skin appears warm, dry and intact. No rash noted. Psychiatric: Mood and affect are normal. Speech and behavior are normal. ___________________________________________   LABS (all labs ordered are listed, but only abnormal results are displayed)  Labs Reviewed  RAPID STREP SCREEN ([facility] ONLY)  CULTURE, GROUP A STREP [facility])     PROCEDURES Procedures   INITIAL IMPRESSION / ASSESSMENT AND PLAN / ED COURSE  Pertinent labs & imaging results that were available during my care of the patient were reviewed by me and considered in my medical decision making (see chart for details).  Overall well-appearing patient.  No acute distress.  Strep negative, will culture.  Suspect influenza-like illness.  Discussed treatment options with patient, patient requests Xofluza.  Rx given.  Encourage rest, fluids, supportive care, over-the-counter Tylenol ibuprofen.  Work note given.Discussed indication, risks and benefits of medications with patient.  Discussed follow up with Primary care physician this week. Discussed follow up and return parameters including no resolution or any worsening concerns. Patient verbalized understanding and agreed to plan.   ____________________________________________   FINAL CLINICAL IMPRESSION(S) / ED DIAGNOSES  Final diagnoses:  Influenza-like illness     ED Discharge Orders         Ordered    Baloxavir Marboxil,40 MG Dose, (XOFLUZA) 2 x 20 MG TBPK   Once     12/27/18 2000           Note: This dictation was prepared with Dragon dictation along with smaller phrase technology. Any transcriptional errors that result from this process are unintentional.           Renford DillsMiller, Teshawn Moan, NP 12/27/18 2006

## 2018-12-27 NOTE — ED Triage Notes (Signed)
Patient c/o nasal congestion that started this morning. She states after lunch she started having body aches. Patient does c/o sore throat that also started this afternoon. Denies fever.

## 2018-12-28 ENCOUNTER — Ambulatory Visit: Payer: BLUE CROSS/BLUE SHIELD | Admitting: Gastroenterology

## 2018-12-30 LAB — CULTURE, GROUP A STREP (THRC)

## 2019-01-04 ENCOUNTER — Ambulatory Visit
Admission: EM | Admit: 2019-01-04 | Discharge: 2019-01-04 | Disposition: A | Discharge status: Home / Self Care | Payer: BLUE CROSS/BLUE SHIELD | Attending: Family Medicine | Admitting: Family Medicine

## 2019-01-04 ENCOUNTER — Other Ambulatory Visit: Payer: Self-pay

## 2019-01-04 DIAGNOSIS — R059 Cough, unspecified: Secondary | ICD-10-CM

## 2019-01-04 DIAGNOSIS — R05 Cough: Secondary | ICD-10-CM | POA: Diagnosis not present

## 2019-01-04 MED ORDER — BENZONATATE 200 MG PO CAPS: 200.0000 mg | ORAL_CAPSULE | Freq: Three times a day (TID) | ORAL | 0 refills | Status: DC | PRN

## 2019-01-04 MED ORDER — PREDNISONE 20 MG PO TABS: 20.0000 mg | ORAL_TABLET | Freq: Every day | ORAL | 0 refills | Status: DC

## 2019-01-04 NOTE — ED Provider Notes (Signed)
[facility]    CSN: [id number] Arrival date & time: 01/04/19  1305     History   Chief Complaint Chief Complaint  Patient presents with  . Appointment  . Headache  . Cough    HPI Shanin R Barga is a 33 y.o. female.   The history is provided by the patient.  URI  Presenting symptoms: congestion, cough and rhinorrhea   Severity:  Moderate Onset quality:  Sudden Duration:  8 days Timing:  Constant Progression:  Unchanged Chronicity:  New Relieved by:  Nothing Ineffective treatments:  OTC medications Associated symptoms: headaches   Associated symptoms: no wheezing   Risk factors: recent illness (had flu last week)     Past Medical History:  Diagnosis Date  . HSV infection    history  . LGSIL on Pap smear of cervix 01/30/2014    There are no active problems to display for this patient.   Past Surgical History:  Procedure Laterality Date  . none      OB History    Gravida  1   Para  1   Term  1   Preterm      AB      Living  1     SAB      TAB      Ectopic      Multiple      Live Births  1        Obstetric Comments  2015-umbilical cord around baby's neck         Home Medications    Prior to Admission medications   Medication Sig Start Date End Date Taking? Authorizing Provider  benzonatate (TESSALON) 200 MG capsule Take 1 capsule (200 mg total) by mouth 3 (three) times daily as needed. 01/04/19   Payton Mccallumonty, Aliyanna Wassmer, MD  clonazePAM Scarlette Calico(KLONOPIN) 0.5 MG tablet Take by mouth. 07/22/17   [provider]  desogestrel-ethinyl estradiol (MIRCETTE) 0.15-0.02/0.01 MG (21/5) tablet Take 1 tablet by mouth at bedtime. 12/08/18   Linzie CollinEvans, David James, MD  DULoxetine (CYMBALTA) 20 MG capsule Take by mouth. 10/04/18 10/04/19  [provider]  predniSONE (DELTASONE) 20 MG tablet Take 1 tablet (20 mg total) by mouth daily. 01/04/19   Payton Mccallumonty, Shellby Schlink, MD  Probiotic Product (PROBIOTIC-10 PO) Take by mouth.    [provider]  sodium fluoride (PREVIDENT) 1.1 % GEL dental gel  01/29/15   [provider]  traZODone (DESYREL) 50 MG tablet TK 1 TO 2 TS PO Q NIGHT PRF SLP 10/11/18   [provider]  valACYclovir (VALTREX) 1000 MG tablet TK 2 TS PO IN THE MORNING AND IN THE EVE 02/14/18   [provider]    Family History Family History  Problem Relation Age of Onset  . Seizures Mother   . Hypertension Father   . Seizures Sister   . Cancer Maternal Grandmother [phone]       breast   . Thyroid disease Maternal Grandmother     Social History Social History   Tobacco Use  . Smoking status: Never Smoker  . Smokeless tobacco: Never Used  Substance Use Topics  . Alcohol use: Yes    Comment: social  . Drug use: No     Allergies   Patient has no known allergies.   Review of Systems Review of Systems  HENT: Positive for congestion and rhinorrhea.   Respiratory: Positive for cough. Negative for wheezing.   Neurological: Positive for headaches.     Physical Exam  Triage Vital Signs ED Triage Vitals  Enc Vitals Group     BP 01/04/19 1315 129/81     Pulse Rate 01/04/19 1315 77     Resp 01/04/19 1315 16     Temp 01/04/19 1315 97.8 F (36.6 C)     Temp Source 01/04/19 1315 Oral     SpO2 01/04/19 1315 100 %     Weight 01/04/19 1316 160 lb 0.9 oz (72.6 kg)     Height 01/04/19 1316 5\' 3"  (1.6 m)     Head Circumference --      Peak Flow --      Pain Score 01/04/19 1316 6     Pain Loc --      Pain Edu? --      Excl. in GC? --    No data found.  Updated Vital Signs BP 129/81 (BP Location: Left Arm)   Pulse 77   Temp 97.8 F (36.6 C) (Oral)   Resp 16   Ht 5\' 3"  (1.6 m)   Wt 72.6 kg   LMP 12/24/2018   SpO2 100%   BMI 28.35 kg/m   Visual Acuity Right Eye Distance:   Left Eye Distance:   Bilateral Distance:    Right Eye Near:   Left Eye Near:    Bilateral Near:     Physical Exam Vitals signs and nursing note reviewed.  Constitutional:      General: She is not  in acute distress.    Appearance: She is well-developed. She is not toxic-appearing or diaphoretic.  HENT:     Head: Normocephalic and atraumatic.     Right Ear: Tympanic membrane, ear canal and external ear normal.     Left Ear: Tympanic membrane, ear canal and external ear normal.     Nose: Congestion and rhinorrhea present.     Mouth/Throat:     Pharynx: Uvula midline. No oropharyngeal exudate.  Eyes:     General: No scleral icterus.       Right eye: No discharge.        Left eye: No discharge.  Neck:     Musculoskeletal: Normal range of motion and neck supple.     Thyroid: No thyromegaly.  Cardiovascular:     Rate and Rhythm: Normal rate and regular rhythm.     Heart sounds: Normal heart sounds.  Pulmonary:     Effort: Pulmonary effort is normal. No respiratory distress.     Breath sounds: Normal breath sounds. No stridor. No wheezing, rhonchi or rales.  Lymphadenopathy:     Cervical: No cervical adenopathy.  Neurological:     Mental Status: She is alert.      UC Treatments / Results  Labs (all labs ordered are listed, but only abnormal results are displayed) Labs Reviewed - No data to display  EKG None  Radiology No results found.  Procedures Procedures (including critical care time)  Medications Ordered in UC Medications - No data to display  Initial Impression / Assessment and Plan / UC Course  I have reviewed the triage vital signs and the nursing notes.  Pertinent labs & imaging results that were available during my care of the patient were reviewed by me and considered in my medical decision making (see chart for details).     Final Clinical Impressions(s) / UC Diagnoses   Final diagnoses:  Cough    ED Prescriptions    Medication Sig Dispense Auth. Provider   predniSONE (DELTASONE) 20 MG tablet Take 1 tablet (  20 mg total) by mouth daily. 7 tablet Davisha Linthicum, Pamala Hurryrlando, MD   benzonatate (TESSALON) 200 MG capsule Take 1 capsule (200 mg total) by mouth 3  (three) times daily as needed. 30 capsule Payton Mccallumonty, Eisen Robenson, MD     1. diagnosis reviewed with patient 2. rx as per orders above; reviewed possible side effects, interactions, risks and benefits  3. Recommend supportive treatment as above 4. Follow-up prn if symptoms worsen or don't improve   Controlled Substance Prescriptions [place] Controlled Substance Registry consulted? Not Applicable   Payton Mccallumonty, Haruki Arnold, MD 01/04/19 1406

## 2019-01-04 NOTE — ED Triage Notes (Signed)
Pt was diagnosed with the flu last week. Took antiviral but still has cough and has headache. Pain 6/10

## 2019-01-04 NOTE — Discharge Instructions (Signed)
Sudafed  Flonase

## 2019-01-18 ENCOUNTER — Ambulatory Visit: Payer: BLUE CROSS/BLUE SHIELD | Admitting: Certified Nurse Midwife

## 2019-01-18 VITALS — BP 133/99 | HR 84 | Ht 63.0 in | Wt 168.0 lb

## 2019-01-18 DIAGNOSIS — N644 Mastodynia: Secondary | ICD-10-CM

## 2019-01-18 MED ORDER — FLUCONAZOLE 150 MG PO TABS: 150.0000 mg | ORAL_TABLET | Freq: Once | ORAL | 1 refills | Status: AC

## 2019-01-18 NOTE — Progress Notes (Signed)
GYN ENCOUNTER NOTE  Subjective:       Theresa Dudley is a 33 y.o. G40P1001 female is here for gynecologic evaluation of the following issues:  1. Breast tenderness x 5 days, more so on right side. She denies recent trauma to the area. She denies any nipple discharge or feeling in lumps . It is better today . She states that that she massaged the area last night. She does admit to having history of breast tenderness while on pill in the past. She started the pill in January to help control bleeding, cramps.     Gynecologic History Patient's last menstrual period was 12/24/2018. Contraception: OCP (estrogen/progesterone) Last Pap: 03/31/2018. Results were: ASCUS, HPV neg.  Last mammogram: N/a .  Obstetric History OB History  Gravida Para Term Preterm AB Living  1 1 1     1   SAB TAB Ectopic Multiple Live Births          1    # Outcome Date GA Lbr Len/2nd Weight Sex Delivery Anes PTL Lv  1 Term 08/09/14 [redacted]w[redacted]d  7 lb 1.6 oz (3.221 kg) F Vag-Spont   LIV    Obstetric Comments  2015-umbilical cord around baby's neck    Past Medical History:  Diagnosis Date  . HSV infection    history  . 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 none      Current Outpatient Medications on File Prior to Visit  Medication Sig Dispense Refill  . benzonatate (TESSALON) 200 MG capsule Take 1 capsule (200 mg total) by mouth 3 (three) times daily as needed. 30 capsule 0  . clonazePAM (KLONOPIN) 0.5 MG tablet Take by mouth.    . desogestrel-ethinyl estradiol (MIRCETTE) 0.15-0.02/0.01 MG (21/5) tablet Take 1 tablet by mouth at bedtime. 3 Package 1  . DULoxetine (CYMBALTA) 20 MG capsule Take by mouth.    . predniSONE (DELTASONE) 20 MG tablet Take 1 tablet (20 mg total) by mouth daily. 7 tablet 0  . Probiotic Product (PROBIOTIC-10 PO) Take by mouth.    . sodium fluoride (PREVIDENT) 1.1 % GEL dental gel     . traZODone (DESYREL) 50 MG tablet TK 1 TO 2 TS PO Q  NIGHT PRF SLP  1  . valACYclovir (VALTREX) 1000 MG tablet TK 2 TS PO IN THE MORNING AND IN THE EVE  3   No current facility-administered medications on file prior to visit.     No Known Allergies  Social History   Socioeconomic History  . Marital status: Married    Spouse name: Not on file  . Number of children: Not on file  . Years of education: Not on file  . Highest education level: Not on file  Occupational History  . Not on file  Social Needs  . Financial resource strain: Not on file  . Food insecurity:    Worry: Not on file    Inability: Not on file  . Transportation needs:    Medical: Not on file    Non-medical: Not on file  Tobacco Use  . Smoking status: Never Smoker  . Smokeless tobacco: Never Used  Substance and Sexual Activity  . Alcohol use: Yes    Comment: social  . Drug use: No  . Sexual activity: Yes    Partners: Male    Birth control/protection: Other-see comments    Comment: VASECTOMY  Lifestyle  . Physical activity:    Days per  week: Not on file    Minutes per session: Not on file  . Stress: Not on file  Relationships  . Social connections:    Talks on phone: Not on file    Gets together: Not on file    Attends religious service: Not on file    Active member of club or organization: Not on file    Attends meetings of clubs or organizations: Not on file    Relationship status: Not on file  . Intimate partner violence:    Fear of current or ex partner: Not on file    Emotionally abused: Not on file    Physically abused: Not on file    Forced sexual activity: Not on file  Other Topics Concern  . Not on file  Social History Narrative  . Not on file    Family History  Problem Relation Age of Onset  . Seizures Mother   . Hypertension Father   . Seizures Sister   . Cancer Maternal Grandmother 92       breast   . Thyroid disease Maternal Grandmother     The following portions of the patient's history were reviewed and updated as  appropriate: allergies, current medications, past family history, past medical history, past social history, past surgical history and problem list.  Review of Systems Review of Systems - Negative except as mentioned in HPI Review of Systems - General ROS: negative for - chills, fatigue, fever, hot flashes, malaise or night sweats Hematological and Lymphatic ROS: negative for - bleeding problems or swollen lymph nodes Gastrointestinal ROS: negative for - abdominal pain, blood in stools, change in bowel habits and nausea/vomiting Musculoskeletal ROS: negative for - joint pain, muscle pain or muscular weakness Genito-Urinary ROS: negative for - change in menstrual cycle, dysmenorrhea, dyspareunia, dysuria, genital ulcers, hematuria, incontinence, irregular/heavy menses, nocturia or pelvic pain. Positive for white itchy discharge, pt state she was recently on antibiotics for flu.   Objective:   LMP 12/24/2018  CONSTITUTIONAL: Well-developed, well-nourished female in no acute distress.  HENT:  Normocephalic, atraumatic.  NECK: Normal range of motion, supple, no masses.  Normal thyroid.  SKIN: Skin is warm and dry. No rash noted. Not diaphoretic. No erythema. No pallor. NEUROLGIC: Alert and oriented to person, place, and time. PSYCHIATRIC: Normal mood and affect. Normal behavior. Normal judgment and thought content. CARDIOVASCULAR:Not Examined RESPIRATORY: Not Examined BREASTS:: breasts appear normal, no suspicious masses, no skin or nipple changes or axillary nodes, right breast normal without mass, skin or nipple changes or axillary nodes, left breast normal without mass, skin or nipple changes or axillary nodes. ABDOMEN: Soft, non distended; Non tender.  No Organomegaly. PELVIC:not indicated MUSCULOSKELETAL: Normal range of motion. No tenderness.  No cyanosis, clubbing, or edema.   Assessment:   1. Breast pain   Plan:   Reviewed normal breast pain with cycle. Encouraged use of good  supportive bra and motrin. Discussed doing mammogram if pain persists and is not cyclical. Diflucan order for white particulate discharge that is itchy. She was recently on antibiotics and states she has recurrence of yeast. Encouraged use of boric acid. She verbalizes and agrees to plan of care.   Doreene Burke, CNM

## 2019-02-09 ENCOUNTER — Other Ambulatory Visit: Payer: Self-pay

## 2019-02-09 MED ORDER — VALACYCLOVIR HCL 1 G PO TABS: 1000.0000 mg | ORAL_TABLET | Freq: Two times a day (BID) | ORAL | 3 refills | Status: DC

## 2019-04-06 ENCOUNTER — Encounter: Payer: BLUE CROSS/BLUE SHIELD | Admitting: Certified Nurse Midwife

## 2019-04-12 ENCOUNTER — Telehealth: Payer: Self-pay

## 2019-04-12 NOTE — Telephone Encounter (Signed)
Pt called no answer LM to call the office for prescreening.  

## 2019-04-13 ENCOUNTER — Ambulatory Visit: Payer: BLUE CROSS/BLUE SHIELD | Admitting: Obstetrics and Gynecology

## 2019-04-13 ENCOUNTER — Other Ambulatory Visit: Payer: Self-pay

## 2019-04-13 ENCOUNTER — Encounter: Payer: Self-pay | Admitting: Obstetrics and Gynecology

## 2019-04-13 VITALS — BP 131/85 | HR 80 | Ht 63.0 in | Wt 171.0 lb

## 2019-04-13 DIAGNOSIS — N920 Excessive and frequent menstruation with regular cycle: Secondary | ICD-10-CM

## 2019-04-13 DIAGNOSIS — Z3009 Encounter for other general counseling and advice on contraception: Secondary | ICD-10-CM

## 2019-04-13 MED ORDER — DESOGESTREL-ETHINYL ESTRADIOL 0.15-0.02/0.01 MG (21/5) PO TABS: 1.0000 | ORAL_TABLET | Freq: Every day | ORAL | 1 refills | Status: DC

## 2019-04-13 NOTE — Progress Notes (Signed)
Patient comes in today for 4 month follow up. She is liking the OCP and would like to stay on this.

## 2019-04-13 NOTE — Progress Notes (Signed)
HPI:      [name] is a 33 y.o. G1P1001 who LMP was Patient's last menstrual period was 04/11/2019.  Subjective:   She presents today for follow-up of OCPs.  Patient was taking them for cycle control and dysmenorrhea reasons.  She is very happy with Mircette.  She has a short regulated cycle.  Present husband has a vasectomy for birth control).  She would like to continue her pills. We have again discussed her family history of breast cancer (grandmother).    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 She does not have a problem list on file. She  has a past surgical history that includes none. Her family history includes Cancer (age of onset: 23) in her maternal grandmother; Hypertension in her father; Seizures in her mother and sister; Thyroid disease in her maternal grandmother. She  reports that she has never smoked. She has never used smokeless tobacco. She reports current alcohol use. She reports that she does not use drugs. She has a current medication list which includes the following prescription(s): cetirizine, clonazepam, desogestrel-ethinyl estradiol, duloxetine, probiotic product, sodium fluoride, trazodone, valacyclovir, and valacyclovir. She has No Known Allergies.       Review of Systems:  Review of Systems  Constitutional: Denied constitutional symptoms, night sweats, recent illness, fatigue, fever, insomnia and weight loss.  Eyes: Denied eye symptoms, eye pain, photophobia, vision change and visual disturbance.  Ears/Nose/Throat/Neck: Denied ear, nose, throat or neck symptoms, hearing loss, nasal discharge, sinus congestion and sore throat.  Cardiovascular: Denied cardiovascular symptoms, arrhythmia, chest pain/pressure, edema, exercise intolerance, orthopnea and palpitations.  Respiratory: Denied pulmonary  symptoms, asthma, pleuritic pain, productive sputum, cough, dyspnea and wheezing.  Gastrointestinal: Denied, gastro-esophageal reflux, melena, nausea and vomiting.  Genitourinary: Denied genitourinary symptoms including symptomatic vaginal discharge, pelvic relaxation issues, and urinary complaints.  Musculoskeletal: Denied musculoskeletal symptoms, stiffness, swelling, muscle weakness and myalgia.  Dermatologic: Denied dermatology symptoms, rash and scar.  Neurologic: Denied neurology symptoms, dizziness, headache, neck pain and syncope.  Psychiatric: Denied psychiatric symptoms, anxiety and depression.  Endocrine: Denied endocrine symptoms including hot flashes and night sweats.   Meds:   Current Outpatient Medications on File Prior to Visit  Medication Sig Dispense Refill  . cetirizine (ZYRTEC) 10 MG tablet     . clonazePAM (KLONOPIN) 0.5 MG tablet Take by mouth.    . DULoxetine (CYMBALTA) 30 MG capsule     . Probiotic Product (PROBIOTIC-10 PO) Take by mouth.    . sodium fluoride (PREVIDENT) 1.1 % GEL dental gel     . traZODone (DESYREL) 50 MG tablet TK 1 TO 2 TS PO Q NIGHT PRF SLP  1  . valACYclovir (VALTREX) 1000 MG tablet TK 2 TS PO IN THE MORNING AND IN THE EVE  3  . valACYclovir (VALTREX) 1000 MG tablet Take 1 tablet (1,000 mg total) by mouth 2 (two) times daily. Take for ten days. 20 tablet 3   No current facility-administered medications on file prior to visit.     Objective:     Vitals:   04/13/19 0841  BP: 131/85  Pulse: 80                Assessment:    G1P1001 There are no active problems to display for this patient.  1. Birth control counseling   2. Menorrhagia with regular cycle     Patient doing very well on OCPs for cycle control and dysmenorrhea and would like to continue.   Plan:            1.  Continue Mircette  2.  Recommend annual examination in 6 weeks  3.  COVID-19 discussed Orders No orders of the defined types were placed in this  encounter.    Meds ordered this encounter  Medications  . desogestrel-ethinyl estradiol (MIRCETTE) 0.15-0.02/0.01 MG (21/5) tablet    Sig: Take 1 tablet by mouth at bedtime.    Dispense:  3 Package    Refill:  1      F/U  Return in about 6 weeks (around 05/25/2019) for Annual Physical. I spent 16 minutes involved in the care of this patient of which greater than 50% was spent discussing OCPs, family history of breast cancer, COVID-19.  Elonda Huskyavid J. , M.D. 04/13/2019 9:19 AM

## 2019-05-25 ENCOUNTER — Encounter: Payer: BLUE CROSS/BLUE SHIELD | Admitting: Obstetrics and Gynecology

## 2019-10-03 ENCOUNTER — Telehealth: Payer: Self-pay

## 2019-10-03 MED ORDER — FLUCONAZOLE 150 MG PO TABS: 150.0000 mg | ORAL_TABLET | Freq: Once | ORAL | 1 refills | Status: AC

## 2019-10-03 NOTE — Telephone Encounter (Signed)
Diflucan sent to preferred pharmacy. Mychart message sent to patient.

## 2019-10-19 ENCOUNTER — Ambulatory Visit (INDEPENDENT_AMBULATORY_CARE_PROVIDER_SITE_OTHER): Payer: BC Managed Care – PPO | Admitting: Certified Nurse Midwife

## 2019-10-19 ENCOUNTER — Other Ambulatory Visit: Payer: Self-pay

## 2019-10-19 ENCOUNTER — Encounter: Payer: Self-pay | Admitting: Certified Nurse Midwife

## 2019-10-19 ENCOUNTER — Other Ambulatory Visit (HOSPITAL_COMMUNITY)
Admission: RE | Admit: 2019-10-19 | Discharge: 2019-10-19 | Disposition: A | Discharge status: Home / Self Care | Payer: BC Managed Care – PPO | Source: Ambulatory Visit | Attending: Certified Nurse Midwife | Admitting: Certified Nurse Midwife

## 2019-10-19 VITALS — BP 130/86 | HR 76 | Ht 63.0 in | Wt 170.3 lb

## 2019-10-19 DIAGNOSIS — N898 Other specified noninflammatory disorders of vagina: Secondary | ICD-10-CM | POA: Diagnosis not present

## 2019-10-19 DIAGNOSIS — N920 Excessive and frequent menstruation with regular cycle: Secondary | ICD-10-CM | POA: Diagnosis not present

## 2019-10-19 DIAGNOSIS — Z01419 Encounter for gynecological examination (general) (routine) without abnormal findings: Secondary | ICD-10-CM | POA: Diagnosis not present

## 2019-10-19 DIAGNOSIS — Z124 Encounter for screening for malignant neoplasm of cervix: Secondary | ICD-10-CM

## 2019-10-19 MED ORDER — DESOGESTREL-ETHINYL ESTRADIOL 0.15-0.02/0.01 MG (21/5) PO TABS: 1.0000 | ORAL_TABLET | Freq: Every day | ORAL | 3 refills | Status: DC

## 2019-10-19 NOTE — Patient Instructions (Signed)
Preventive Care 21-32 Years Old, Female Preventive care refers to visits with your health care provider and lifestyle choices that can promote health and wellness. This includes:  A yearly physical exam. This may also be called an annual well check.  Regular dental visits and eye exams.  Immunizations.  Screening for certain conditions.  Healthy lifestyle choices, such as eating a healthy diet, getting regular exercise, not using drugs or products that contain nicotine and tobacco, and limiting alcohol use. What can I expect for my preventive care visit? Physical exam Your health care provider will check your:  Height and weight. This may be used to calculate body mass index (BMI), which tells if you are at a healthy weight.  Heart rate and blood pressure.  Skin for abnormal spots. Counseling Your health care provider may ask you questions about your:  Alcohol, tobacco, and drug use.  Emotional well-being.  Home and relationship well-being.  Sexual activity.  Eating habits.  Work and work environment.  Method of birth control.  Menstrual cycle.  Pregnancy history. What immunizations do I need?  Influenza (flu) vaccine  This is recommended every year. Tetanus, diphtheria, and pertussis (Tdap) vaccine  You may need a Td booster every 10 years. Varicella (chickenpox) vaccine  You may need this if you have not been vaccinated. Human papillomavirus (HPV) vaccine  If recommended by your health care provider, you may need three doses over 6 months. Measles, mumps, and rubella (MMR) vaccine  You may need at least one dose of MMR. You may also need a second dose. Meningococcal conjugate (MenACWY) vaccine  One dose is recommended if you are age 19-21 years and a first-year college student living in a residence hall, or if you have one of several medical conditions. You may also need additional booster doses. Pneumococcal conjugate (PCV13) vaccine  You may need  this if you have certain conditions and were not previously vaccinated. Pneumococcal polysaccharide (PPSV23) vaccine  You may need one or two doses if you smoke cigarettes or if you have certain conditions. Hepatitis A vaccine  You may need this if you have certain conditions or if you travel or work in places where you may be exposed to hepatitis A. Hepatitis B vaccine  You may need this if you have certain conditions or if you travel or work in places where you may be exposed to hepatitis B. Haemophilus influenzae type b (Hib) vaccine  You may need this if you have certain conditions. You may receive vaccines as individual doses or as more than one vaccine together in one shot (combination vaccines). Talk with your health care provider about the risks and benefits of combination vaccines. What tests do I need?  Blood tests  Lipid and cholesterol levels. These may be checked every 5 years starting at age 20.  Hepatitis C test.  Hepatitis B test. Screening  Diabetes screening. This is done by checking your blood sugar (glucose) after you have not eaten for a while (fasting).  Sexually transmitted disease (STD) testing.  BRCA-related cancer screening. This may be done if you have a family history of breast, ovarian, tubal, or peritoneal cancers.  Pelvic exam and Pap test. This may be done every 3 years starting at age 21. Starting at age 30, this may be done every 5 years if you have a Pap test in combination with an HPV test. Talk with your health care provider about your test results, treatment options, and if necessary, the need for more tests.   Follow these instructions at home: Eating and drinking   Eat a diet that includes fresh fruits and vegetables, whole grains, lean protein, and low-fat dairy.  Take vitamin and mineral supplements as recommended by your health care provider.  Do not drink alcohol if: ? Your health care provider tells you not to drink. ? You are  pregnant, may be pregnant, or are planning to become pregnant.  If you drink alcohol: ? Limit how much you have to 0-1 drink a day. ? Be aware of how much alcohol is in your drink. In the [place] one drink equals one 12 oz bottle of beer (355 mL), one 5 oz glass of wine (148 mL), or one 1 oz glass of hard liquor (44 mL). Lifestyle  Take daily care of your teeth and gums.  Stay active. Exercise for at least 30 minutes on 5 or more days each week.  Do not use any products that contain nicotine or tobacco, such as cigarettes, e-cigarettes, and chewing tobacco. If you need help quitting, ask your health care provider.  If you are sexually active, practice safe sex. Use a condom or other form of birth control (contraception) in order to prevent pregnancy and STIs (sexually transmitted infections). If you plan to become pregnant, see your health care provider for a preconception visit. What's next?  Visit your health care provider once a year for a well check visit.  Ask your health care provider how often you should have your eyes and teeth checked.  Stay up to date on all vaccines. This information is not intended to replace advice given to you by your health care provider. Make sure you discuss any questions you have with your health care provider. Document Released: 01/06/2002 Document Revised: 07/22/2018 Document Reviewed: 07/22/2018 Elsevier Patient Education  2020 [employer]  

## 2019-10-19 NOTE — Progress Notes (Signed)
GYNECOLOGY ANNUAL PREVENTATIVE CARE ENCOUNTER NOTE  History:     Theresa Dudley is a 33 y.o. G74P1001 female here for a routine annual gynecologic exam.  Current complaints: none.   Denies abnormal vaginal bleeding,pelvic pain, problems with intercourse or other gynecologic concerns.  Recently had out break then had yeast , would like testing for BV and yeast today.    Gynecologic History Patient's last menstrual period was 09/25/2019 (exact date). Contraception: OCP (estrogen/progesterone) Last Pap: .03/31/2018 Results were: normal with negative HPV Last mammogram: n/a .  Obstetric History OB History  Gravida Para Term Preterm AB Living  1 1 1     1   SAB TAB Ectopic Multiple Live Births          1    # Outcome Date GA Lbr Len/2nd Weight Sex Delivery Anes PTL Lv  1 Term 08/09/14 [redacted]w[redacted]d  7 lb 1.6 oz (3.221 kg) F Vag-Spont   LIV    Obstetric Comments  2015-umbilical cord around baby's neck    Past Medical History:  Diagnosis Date  . HSV infection    history  . 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 none      Current Outpatient Medications on File Prior to Visit  Medication Sig Dispense Refill  . busPIRone (BUSPAR) 7.5 MG tablet TK 1 T PO UP TO TID FOR ANXIETY. MAY INCREASE TO 2 T PO UP TO 3 XD AFTER 1 WK IF NO IMPROVEMENT    . cetirizine (ZYRTEC) 10 MG tablet     . clonazePAM (KLONOPIN) 0.5 MG tablet Take by mouth.    . desogestrel-ethinyl estradiol (MIRCETTE) 0.15-0.02/0.01 MG (21/5) tablet Take 1 tablet by mouth at bedtime. 3 Package 1  . Probiotic Product (PROBIOTIC-10 PO) Take by mouth.    . sodium fluoride (PREVIDENT) 1.1 % GEL dental gel     . valACYclovir (VALTREX) 1000 MG tablet TK 2 TS PO IN THE MORNING AND IN THE EVE  3  . VIIBRYD 40 MG TABS Take 40 mg by mouth daily.    . DULoxetine (CYMBALTA) 30 MG capsule     . traZODone (DESYREL) 50 MG tablet TK 1 TO 2 TS PO Q NIGHT PRF SLP  1  . valACYclovir  (VALTREX) 1000 MG tablet Take 1 tablet (1,000 mg total) by mouth 2 (two) times daily. Take for ten days. 20 tablet 3   No current facility-administered medications on file prior to visit.     No Known Allergies  Social History:  reports that she has never smoked. She has never used smokeless tobacco. She reports current alcohol use. She reports that she does not use drugs.  Family History  Problem Relation Age of Onset  . Seizures Mother   . Hypertension Father   . Seizures Sister   . Cancer Maternal Grandmother 63       breast   . Thyroid disease Maternal Grandmother     The following portions of the patient's history were reviewed and updated as appropriate: allergies, current medications, past family history, past medical history, past social history, past surgical history and problem list.  Review of Systems Pertinent items noted in HPI and remainder of comprehensive ROS otherwise negative.  Physical Exam:  BP 130/86   Pulse 76   Ht 5\' 3"  (1.6 m)   Wt 170 lb 5 oz (77.3 kg)   LMP 09/25/2019 (Exact Date)   BMI 30.17 kg/m  CONSTITUTIONAL: Well-developed, well-nourished female, over weight in no acute distress.  HENT:  Normocephalic, atraumatic, External right and left ear normal. Oropharynx is clear and moist EYES: Conjunctivae and EOM are normal. Pupils are equal, round, and reactive to light. No scleral icterus.  NECK: Normal range of motion, supple, no masses.  Normal thyroid.  SKIN: Skin is warm and dry. No rash noted. Not diaphoretic. No erythema. No pallor. MUSCULOSKELETAL: Normal range of motion. No tenderness.  No cyanosis, clubbing, or edema.  2+ distal pulses. NEUROLOGIC: Alert and oriented to person, place, and time. Normal reflexes, muscle tone coordination.  PSYCHIATRIC: Normal mood and affect. Normal behavior. Normal judgment and thought content. CARDIOVASCULAR: Normal heart rate noted, regular rhythm RESPIRATORY: Clear to auscultation bilaterally. Effort and  breath sounds normal, no problems with respiration noted. BREASTS: Symmetric in size. No masses, tenderness, skin changes, nipple drainage, or lymphadenopathy bilaterally. ABDOMEN: Soft, no distention noted.  No tenderness, rebound or guarding.  PELVIC: Normal appearing external genitalia and urethral meatus; normal appearing vaginal mucosa and cervix.  No abnormal discharge noted.  Pap smear obtained. Vaginal swlab for BV and yeast collected.  Normal uterine size, no other palpable masses, no uterine or adnexal tenderness.   Assessment and Plan:  Annual Well Women GYN Exam  Will follow up results of pap smear and manage accordingly. Mammogram not indicated Labs; pap, and vaginal swab  Refills: OCP Routine preventative health maintenance measures emphasized. Please refer to After Visit Summary for other counseling recommendations.      Doreene Burke, CM

## 2019-10-21 LAB — CERVICOVAGINAL ANCILLARY ONLY
Bacterial Vaginitis (gardnerella): NEGATIVE
Candida Glabrata: NEGATIVE
Candida Vaginitis: NEGATIVE
Comment: NEGATIVE
Comment: NEGATIVE
Comment: NEGATIVE

## 2019-10-25 LAB — CYTOLOGY - PAP
Comment: NEGATIVE
Diagnosis: NEGATIVE
High risk HPV: NEGATIVE

## 2019-11-22 ENCOUNTER — Other Ambulatory Visit: Payer: Self-pay | Admitting: Certified Nurse Midwife

## 2019-11-22 MED ORDER — LEVONORGEST-ETH ESTRAD 91-DAY 0.15-0.03 &0.01 MG PO TABS: 1.0000 | ORAL_TABLET | Freq: Every day | ORAL | 4 refills | Status: DC

## 2019-11-22 NOTE — Progress Notes (Signed)
Pt request change in pill to quaterly scheduled periods. Orders placed.   Philip Aspen, CNM

## 2020-02-01 IMAGING — US US ABDOMEN COMPLETE
1 series · 14 of 25 positions shown · non-contrast
Comparison: CT report 03/24/2004.

CLINICAL DATA: Abdominal pain.

EXAM:
ABDOMEN ULTRASOUND COMPLETE

[Series 1: us abdomen complete · 14 of 85 slices shown]
[im 1/85]
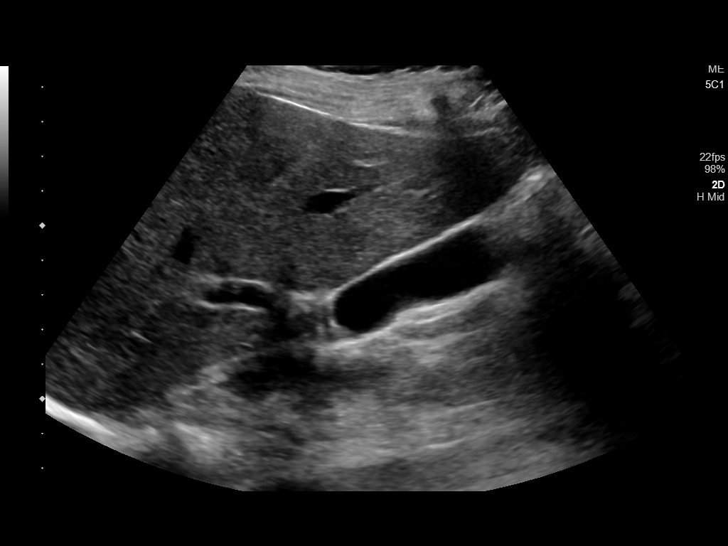
[im 8/85]
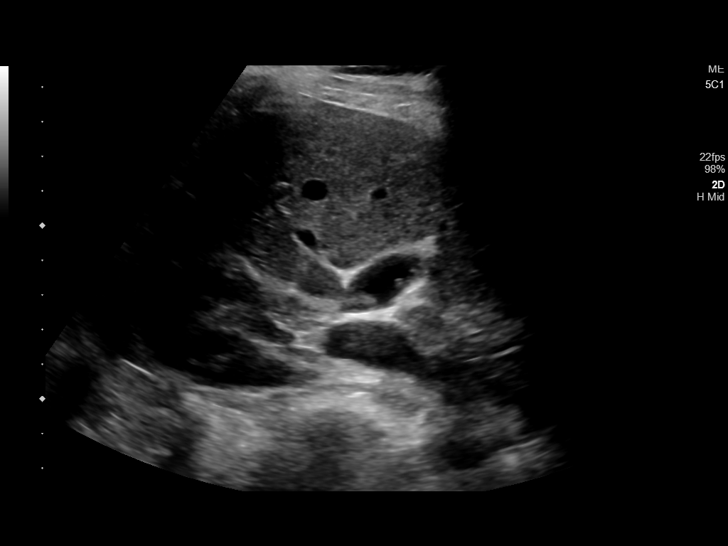
[im 15/85]
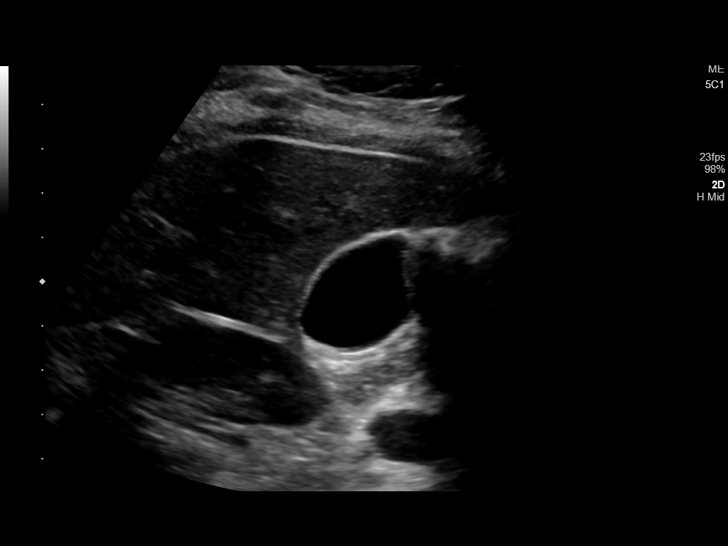
[im 22/85]
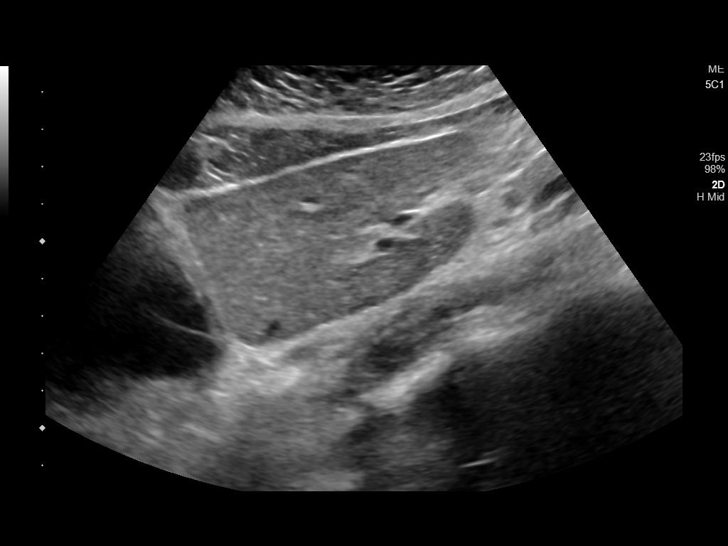
[im 29/85]
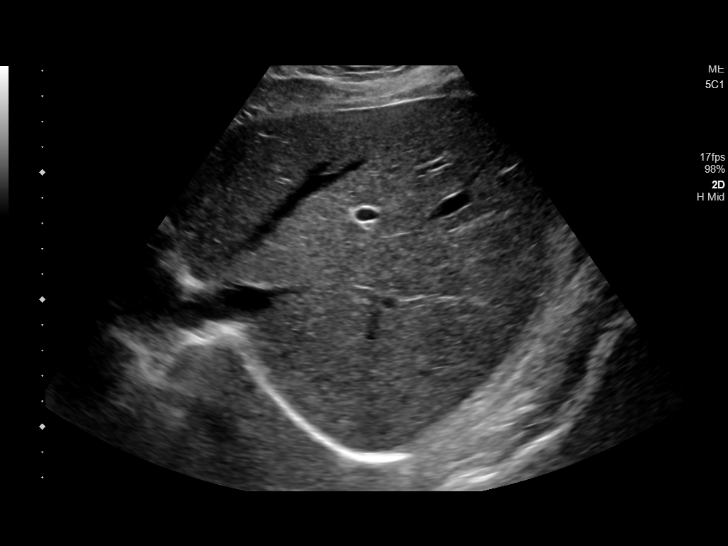
[im 32/85]
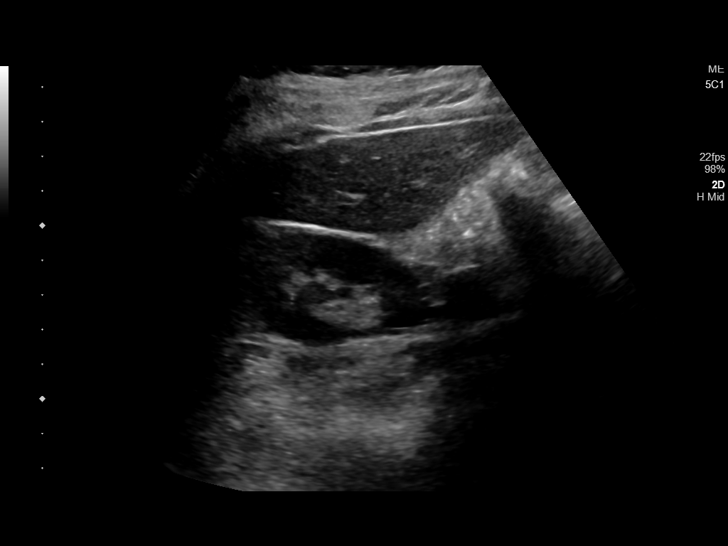
[im 39/85]
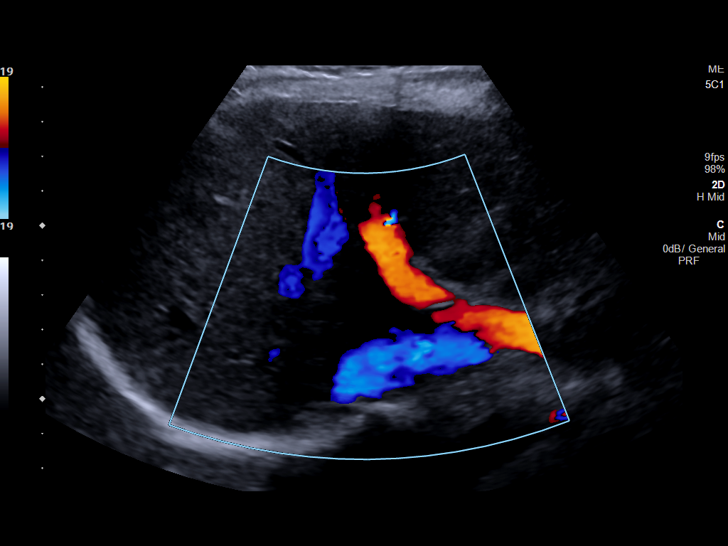
[im 46/85]
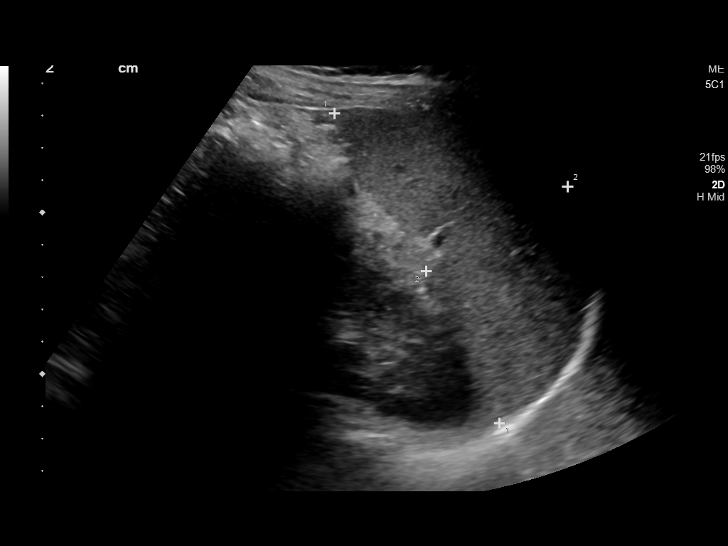
[im 53/85]
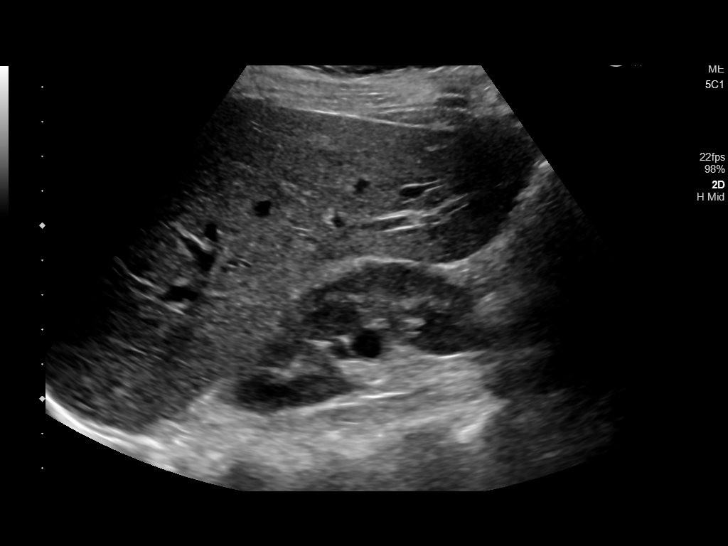
[im 57/85]
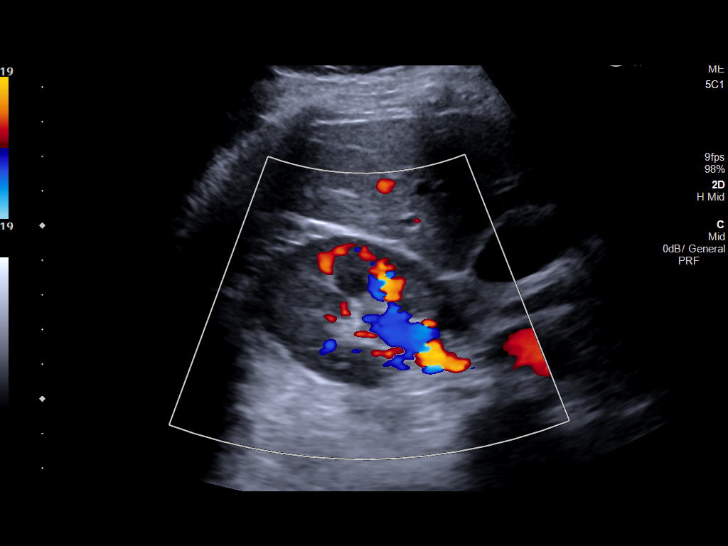
[im 64/85]
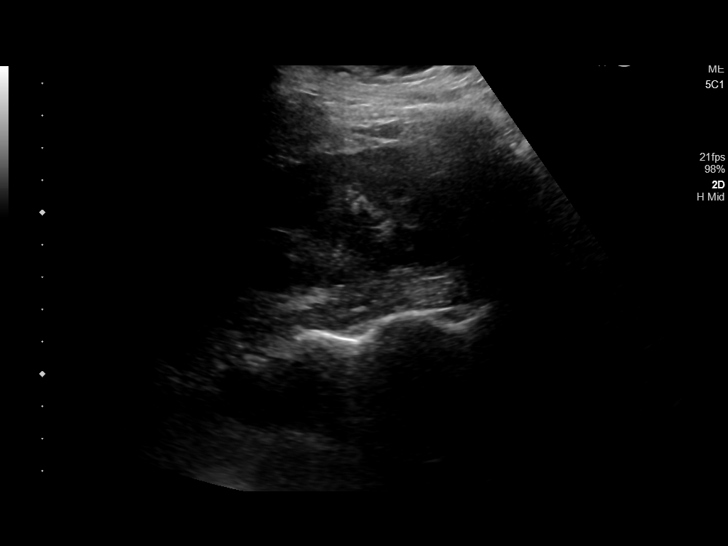
[im 71/85]
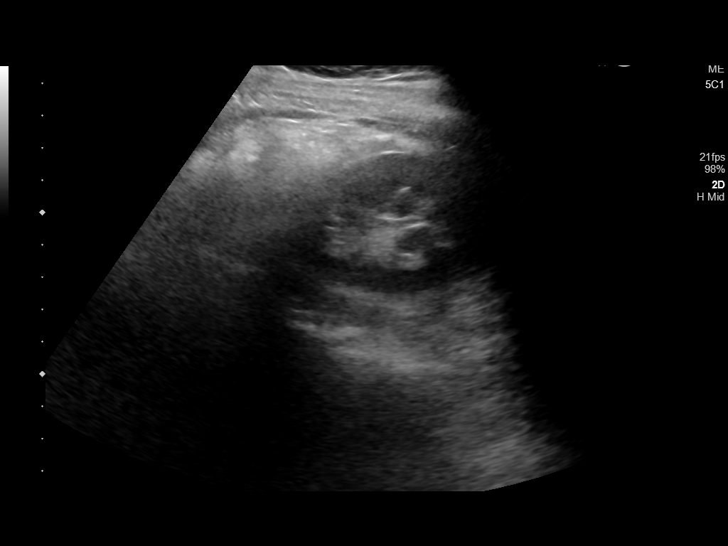
[im 78/85]
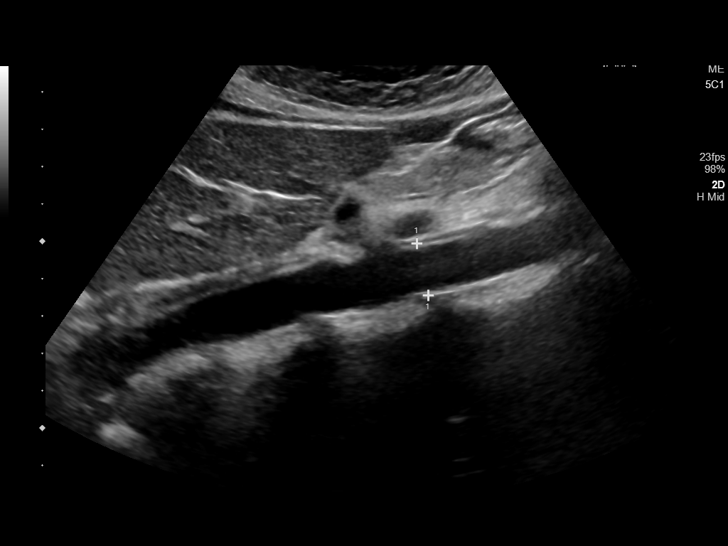
[im 85/85]
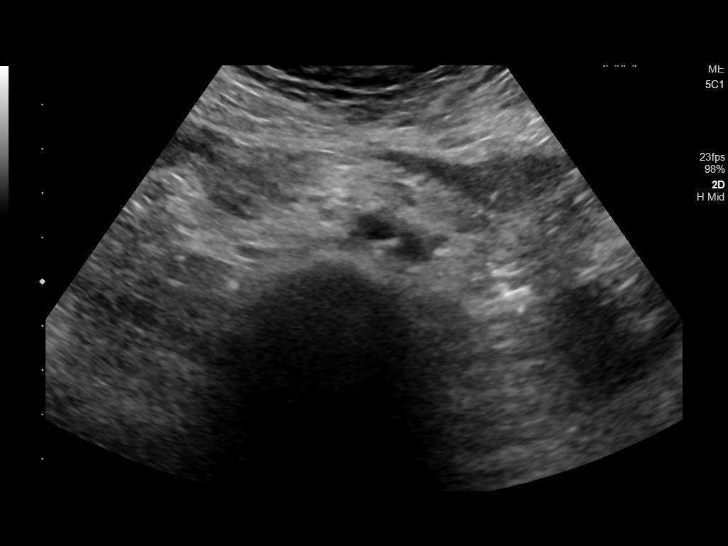

[14 of 25 positions shown; findings below may reference images not displayed]

FINDINGS: Gallbladder: Approximately 4 mm gallbladder polyp noted. No
gallstones noted. No pericholecystic fluid collections. Negative
Murphy sign.

Common bile duct: Diameter: 3.2 mm

Liver: No focal lesion identified. Within normal limits in
parenchymal echogenicity. Portal vein is patent on color Doppler
imaging with normal direction of blood flow towards the liver.

IVC: No abnormality visualized.

Pancreas: Visualized portion unremarkable.

Spleen: Size and appearance within normal limits.

Right Kidney: Length: 10.6 cm. Echogenicity within normal limits. No
mass or hydronephrosis visualized.

Left Kidney: Length: 0.3 cm. Echogenicity within normal limits. No
mass or hydronephrosis visualized.

Abdominal aorta: No aneurysm visualized.

Other findings: None.
IMPRESSION: Approximately 4 mm gallbladder polyp noted.  No gallstones noted.

## 2020-03-20 ENCOUNTER — Other Ambulatory Visit: Payer: Self-pay

## 2020-03-20 MED ORDER — FLUCONAZOLE 150 MG PO TABS: 150.0000 mg | ORAL_TABLET | Freq: Once | ORAL | 1 refills | Status: AC

## 2020-03-20 NOTE — Telephone Encounter (Signed)
Diflucan sent to [facility] in [place] per patient request. Mychart message sent to patient

## 2020-10-23 ENCOUNTER — Encounter: Payer: BC Managed Care – PPO | Admitting: Certified Nurse Midwife

## 2020-11-20 ENCOUNTER — Other Ambulatory Visit (HOSPITAL_COMMUNITY)
Admission: RE | Admit: 2020-11-20 | Discharge: 2020-11-20 | Disposition: A | Discharge status: Home / Self Care | Payer: BC Managed Care – PPO | Source: Ambulatory Visit | Attending: Certified Nurse Midwife | Admitting: Certified Nurse Midwife

## 2020-11-20 ENCOUNTER — Encounter: Payer: Self-pay | Admitting: Certified Nurse Midwife

## 2020-11-20 ENCOUNTER — Other Ambulatory Visit: Payer: Self-pay

## 2020-11-20 ENCOUNTER — Ambulatory Visit (INDEPENDENT_AMBULATORY_CARE_PROVIDER_SITE_OTHER): Payer: BC Managed Care – PPO | Admitting: Certified Nurse Midwife

## 2020-11-20 VITALS — BP 130/86 | HR 74 | Ht 63.0 in | Wt 166.0 lb

## 2020-11-20 DIAGNOSIS — Z1159 Encounter for screening for other viral diseases: Secondary | ICD-10-CM | POA: Diagnosis not present

## 2020-11-20 DIAGNOSIS — Z01419 Encounter for gynecological examination (general) (routine) without abnormal findings: Secondary | ICD-10-CM | POA: Diagnosis not present

## 2020-11-20 DIAGNOSIS — Z114 Encounter for screening for human immunodeficiency virus [HIV]: Secondary | ICD-10-CM | POA: Diagnosis not present

## 2020-11-20 DIAGNOSIS — N898 Other specified noninflammatory disorders of vagina: Secondary | ICD-10-CM

## 2020-11-20 MED ORDER — TRI-LO-SPRINTEC 0.18/0.215/0.25 MG-25 MCG PO TABS: 1.0000 | ORAL_TABLET | Freq: Every day | ORAL | 11 refills | Status: AC

## 2020-11-20 MED ORDER — VALACYCLOVIR HCL 1 G PO TABS: ORAL_TABLET | ORAL | 3 refills | Status: AC

## 2020-11-20 NOTE — Progress Notes (Signed)
Pt present for annual exam. No complaints.

## 2020-11-20 NOTE — Patient Instructions (Signed)
Preventive Care 21-33 Years Old, Female Preventive care refers to visits with your health care provider and lifestyle choices that can promote health and wellness. This includes:  A yearly physical exam. This may also be called an annual well check.  Regular dental visits and eye exams.  Immunizations.  Screening for certain conditions.  Healthy lifestyle choices, such as eating a healthy diet, getting regular exercise, not using drugs or products that contain nicotine and tobacco, and limiting alcohol use. What can I expect for my preventive care visit? Physical exam Your health care provider will check your:  Height and weight. This may be used to calculate body mass index (BMI), which tells if you are at a healthy weight.  Heart rate and blood pressure.  Skin for abnormal spots. Counseling Your health care provider may ask you questions about your:  Alcohol, tobacco, and drug use.  Emotional well-being.  Home and relationship well-being.  Sexual activity.  Eating habits.  Work and work environment.  Method of birth control.  Menstrual cycle.  Pregnancy history. What immunizations do I need?  Influenza (flu) vaccine  This is recommended every year. Tetanus, diphtheria, and pertussis (Tdap) vaccine  You may need a Td booster every 10 years. Varicella (chickenpox) vaccine  You may need this if you have not been vaccinated. Human papillomavirus (HPV) vaccine  If recommended by your health care provider, you may need three doses over 6 months. Measles, mumps, and rubella (MMR) vaccine  You may need at least one dose of MMR. You may also need a second dose. Meningococcal conjugate (MenACWY) vaccine  One dose is recommended if you are age 19-21 years and a first-year college student living in a residence hall, or if you have one of several medical conditions. You may also need additional booster doses. Pneumococcal conjugate (PCV13) vaccine  You may need  this if you have certain conditions and were not previously vaccinated. Pneumococcal polysaccharide (PPSV23) vaccine  You may need one or two doses if you smoke cigarettes or if you have certain conditions. Hepatitis A vaccine  You may need this if you have certain conditions or if you travel or work in places where you may be exposed to hepatitis A. Hepatitis B vaccine  You may need this if you have certain conditions or if you travel or work in places where you may be exposed to hepatitis B. Haemophilus influenzae type b (Hib) vaccine  You may need this if you have certain conditions. You may receive vaccines as individual doses or as more than one vaccine together in one shot (combination vaccines). Talk with your health care provider about the risks and benefits of combination vaccines. What tests do I need?  Blood tests  Lipid and cholesterol levels. These may be checked every 5 years starting at age 20.  Hepatitis C test.  Hepatitis B test. Screening  Diabetes screening. This is done by checking your blood sugar (glucose) after you have not eaten for a while (fasting).  Sexually transmitted disease (STD) testing.  BRCA-related cancer screening. This may be done if you have a family history of breast, ovarian, tubal, or peritoneal cancers.  Pelvic exam and Pap test. This may be done every 3 years starting at age 21. Starting at age 30, this may be done every 5 years if you have a Pap test in combination with an HPV test. Talk with your health care provider about your test results, treatment options, and if necessary, the need for more tests.   Follow these instructions at home: Eating and drinking   Eat a diet that includes fresh fruits and vegetables, whole grains, lean protein, and low-fat dairy.  Take vitamin and mineral supplements as recommended by your health care provider.  Do not drink alcohol if: ? Your health care provider tells you not to drink. ? You are  pregnant, may be pregnant, or are planning to become pregnant.  If you drink alcohol: ? Limit how much you have to 0-1 drink a day. ? Be aware of how much alcohol is in your drink. In the [place] one drink equals one 12 oz bottle of beer (355 mL), one 5 oz glass of wine (148 mL), or one 1 oz glass of hard liquor (44 mL). Lifestyle  Take daily care of your teeth and gums.  Stay active. Exercise for at least 30 minutes on 5 or more days each week.  Do not use any products that contain nicotine or tobacco, such as cigarettes, e-cigarettes, and chewing tobacco. If you need help quitting, ask your health care provider.  If you are sexually active, practice safe sex. Use a condom or other form of birth control (contraception) in order to prevent pregnancy and STIs (sexually transmitted infections). If you plan to become pregnant, see your health care provider for a preconception visit. What's next?  Visit your health care provider once a year for a well check visit.  Ask your health care provider how often you should have your eyes and teeth checked.  Stay up to date on all vaccines. This information is not intended to replace advice given to you by your health care provider. Make sure you discuss any questions you have with your health care provider. Document Revised: 07/22/2018 Document Reviewed: 07/22/2018 Elsevier Patient Education  2020 [employer].

## 2020-11-20 NOTE — Progress Notes (Signed)
GYNECOLOGY ANNUAL PREVENTATIVE CARE ENCOUNTER NOTE  History:     Theresa Dudley is a 34 y.o. G38P1001 female here for a routine annual gynecologic exam.  Current complaints: vaginal discharge, has history of recurrent yeast .   Denies abnormal vaginal bleeding, discharge, pelvic pain, problems with intercourse or other gynecologic concerns.     Social Relationship:married Living:spouse and child ( has 2 step children). [place]  Work:Human resources data abstraction Exercise: spin bike 3-4 times wk x 20 min. Smoke/Alcohol/drug use: denies   Gynecologic History Patient's last menstrual period was 10/29/2020. Contraception: OCP (estrogen/progesterone) and vasectomy Last Pap: 04/10/18. Results were: Ascus with negative HPV Last mammogram: n/a.   Obstetric History OB History  Gravida Para Term Preterm AB Living  1 1 1     1   SAB IAB Ectopic Multiple Live Births          1    # Outcome Date GA Lbr Len/2nd Weight Sex Delivery Anes PTL Lv  1 Term 08/09/14 [redacted]w[redacted]d  7 lb 1.6 oz (3.221 kg) F Vag-Spont   LIV    Obstetric Comments  2015-umbilical cord around baby's neck    Past Medical History:  Diagnosis Date  . HSV infection    history  . 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 none      Current Outpatient Medications on File Prior to Visit  Medication Sig Dispense Refill  . clonazePAM (KLONOPIN) 0.5 MG tablet Take by mouth.    . Levonorgestrel-Ethinyl Estradiol (AMETHIA) 0.15-0.03 &0.01 MG tablet Take 1 tablet by mouth daily. 1 Package 4  . Probiotic Product (PROBIOTIC-10 PO) Take by mouth.    . sodium fluoride (FLUORISHIELD) 1.1 % GEL dental gel     . valACYclovir (VALTREX) 1000 MG tablet TK 2 TS PO IN THE MORNING AND IN THE EVE  3  . DULoxetine (CYMBALTA) 30 MG capsule     . traZODone (DESYREL) 50 MG tablet TK 1 TO 2 TS PO Q NIGHT PRF SLP  1  . valACYclovir (VALTREX) 1000  MG tablet Take 1 tablet (1,000 mg total) by mouth 2 (two) times daily. Take for ten days. 20 tablet 3   No current facility-administered medications on file prior to visit.    No Known Allergies  Social History:  reports that she has never smoked. She has never used smokeless tobacco. She reports current alcohol use. She reports that she does not use drugs.  Family History  Problem Relation Age of Onset  . Seizures Mother   . Hypertension Father   . Seizures Sister   . Cancer Maternal Grandmother 37       breast   . Thyroid disease Maternal Grandmother     The following portions of the patient's history were reviewed and updated as appropriate: allergies, current medications, past family history, past medical history, past social history, past surgical history and problem list.  Review of Systems Pertinent items noted in HPI and remainder of comprehensive ROS otherwise negative.  Physical Exam:  BP 130/86   Pulse 74   Ht 5\' 3"  (1.6 m)   Wt 166 lb (75.3 kg)   LMP 10/29/2020   BMI 29.41 kg/m  CONSTITUTIONAL: Well-developed, well-nourished female in no acute distress.  HENT:  Normocephalic, atraumatic, External right and left ear normal. Oropharynx is clear and moist EYES: Conjunctivae and EOM are normal. Pupils  are equal, round, and reactive to light. No scleral icterus.  NECK: Normal range of motion, supple, no masses.  Normal thyroid.  SKIN: Skin is warm and dry. No rash noted. Not diaphoretic. No erythema. No pallor. MUSCULOSKELETAL: Normal range of motion. No tenderness.  No cyanosis, clubbing, or edema.  2+ distal pulses. NEUROLOGIC: Alert and oriented to person, place, and time. Normal reflexes, muscle tone coordination.  PSYCHIATRIC: Normal mood and affect. Normal behavior. Normal judgment and thought content. CARDIOVASCULAR: Normal heart rate noted, regular rhythm RESPIRATORY: Clear to auscultation bilaterally. Effort and breath sounds normal, no problems with  respiration noted. BREASTS: Symmetric in size. No masses, tenderness, skin changes, nipple drainage, or lymphadenopathy bilaterally.  ABDOMEN: Soft, no distention noted.  No tenderness, rebound or guarding.  PELVIC: Normal appearing external genitalia and urethral meatus; normal appearing vaginal mucosa and cervix.  White discharge noted.  Pap smear not due.  Normal uterine size, no other palpable masses, no uterine or adnexal tenderness.  .   Assessment and Plan:    1. Women's annual routine gynecological examination Pap: not due until 2022 Mammogram :n/a Labs:Hep C, HIV Refills:OCP, Valtrex Referral: none Routine preventative health maintenance measures emphasized. Please refer to After Visit Summary for other counseling recommendations.      Doreene Burke, CNM [facility],  [facility]

## 2020-11-21 LAB — HIV ANTIBODY (ROUTINE TESTING W REFLEX): HIV Screen 4th Generation wRfx: NONREACTIVE

## 2020-11-21 LAB — HEPATITIS C ANTIBODY: Hep C Virus Ab: <0.1 s/co ratio (ref 0.0–0.9)

## 2020-11-22 LAB — CERVICOVAGINAL ANCILLARY ONLY
Candida Glabrata: NEGATIVE
Candida Vaginitis: NEGATIVE
Comment: NEGATIVE
Comment: NEGATIVE

## 2021-11-26 ENCOUNTER — Encounter: Payer: BC Managed Care – PPO | Admitting: Certified Nurse Midwife
